# Patient Record
Sex: Male | Born: 1985 | Race: Black or African American | Hispanic: No | Marital: Single | Smoking: Former smoker
Health system: Southern US, Community
[De-identification: ages and names within clinical notes are randomized; demographics above are authoritative.]

## PROBLEM LIST (undated history)

## (undated) DIAGNOSIS — I10 Essential (primary) hypertension: Secondary | ICD-10-CM

## (undated) DIAGNOSIS — G4730 Sleep apnea, unspecified: Secondary | ICD-10-CM

## (undated) HISTORY — PX: ABDOMINAL SURGERY: SHX537

---

## 2006-09-16 ENCOUNTER — Emergency Department (HOSPITAL_COMMUNITY): Admission: EM | Admit: 2006-09-16 | Discharge: 2006-09-16 | Payer: Self-pay | Admitting: Emergency Medicine

## 2006-09-20 ENCOUNTER — Emergency Department (HOSPITAL_COMMUNITY): Admission: EM | Admit: 2006-09-20 | Discharge: 2006-09-20 | Payer: Self-pay | Admitting: Emergency Medicine

## 2006-09-27 ENCOUNTER — Ambulatory Visit (HOSPITAL_COMMUNITY): Admission: RE | Admit: 2006-09-27 | Discharge: 2006-09-27 | Payer: Self-pay | Admitting: Orthopaedic Surgery

## 2009-01-02 ENCOUNTER — Inpatient Hospital Stay (HOSPITAL_COMMUNITY): Admission: AC | Admit: 2009-01-02 | Discharge: 2009-01-08 | Payer: Self-pay

## 2011-03-07 LAB — BASIC METABOLIC PANEL
BUN: 9 mg/dL (ref 6–23)
CO2: 29 mEq/L (ref 19–32)
CO2: 30 mEq/L (ref 19–32)
Calcium: 9.1 mg/dL (ref 8.4–10.5)
Chloride: 99 mEq/L (ref 96–112)
Creatinine, Ser: 1.31 mg/dL (ref 0.4–1.5)
GFR calc Af Amer: >60 mL/min (ref >60)
GFR calc Af Amer: >60 mL/min (ref >60)
GFR calc non Af Amer: >60 mL/min (ref >60)
Glucose, Bld: 98 mg/dL (ref 70–99)
Glucose, Bld: 98 mg/dL (ref 70–99)
Potassium: 3.5 mEq/L (ref 3.5–5.1)
Potassium: 4.1 mEq/L (ref 3.5–5.1)
Sodium: 135 mEq/L (ref 135–145)

## 2011-03-07 LAB — TYPE AND SCREEN: ABO/RH(D): O POS

## 2011-03-07 LAB — POCT I-STAT, CHEM 8
Chloride: 105 meq/L (ref 96–112)
Creatinine, Ser: 1.3 mg/dL (ref 0.4–1.5)
Glucose, Bld: 105 mg/dL — ABNORMAL HIGH (ref 70–99)
Potassium: 3.3 meq/L — ABNORMAL LOW (ref 3.5–5.1)

## 2011-03-07 LAB — CBC
HCT: 38.8 % — ABNORMAL LOW (ref 39.0–52.0)
HCT: 41.5 % (ref 39.0–52.0)
Hemoglobin: 14.3 g/dL (ref 13.0–17.0)
MCHC: 34.3 g/dL (ref 30.0–36.0)
MCHC: 34.7 g/dL (ref 30.0–36.0)
MCHC: 35 g/dL (ref 30.0–36.0)
MCV: 92.8 fL (ref 78.0–100.0)
Platelets: 265 10*3/uL (ref 150–400)
RBC: 4.11 MIL/uL — ABNORMAL LOW (ref 4.22–5.81)
RBC: 4.36 MIL/uL (ref 4.22–5.81)
RBC: 4.53 MIL/uL (ref 4.22–5.81)
RDW: 12.6 % (ref 11.5–15.5)
WBC: 8.8 10*3/uL (ref 4.0–10.5)

## 2011-03-07 LAB — DIFFERENTIAL
Basophils Absolute: 0 10*3/uL (ref 0.0–0.1)
Basophils Absolute: 0 10*3/uL (ref 0.0–0.1)
Basophils Relative: 0 % (ref 0–1)
Eosinophils Absolute: 0 10*3/uL (ref 0.0–0.7)
Eosinophils Relative: 0 % (ref 0–5)
Lymphs Abs: 2.4 10*3/uL (ref 0.7–4.0)
Monocytes Relative: 7 % (ref 3–12)
Neutro Abs: 6.7 10*3/uL (ref 1.7–7.7)
Neutrophils Relative %: 72 % (ref 43–77)

## 2011-03-07 LAB — PROTIME-INR
INR: 1 (ref 0.00–1.49)
Prothrombin Time: 12.8 s (ref 11.6–15.2)
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
## 2015-08-08 ENCOUNTER — Emergency Department (HOSPITAL_COMMUNITY): Payer: BLUE CROSS/BLUE SHIELD

## 2015-08-08 ENCOUNTER — Emergency Department (HOSPITAL_COMMUNITY)
Admission: EM | Admit: 2015-08-08 | Discharge: 2015-08-08 | Disposition: A | Discharge status: Home / Self Care | Payer: BLUE CROSS/BLUE SHIELD | Attending: Emergency Medicine | Admitting: Emergency Medicine

## 2015-08-08 ENCOUNTER — Encounter (HOSPITAL_COMMUNITY): Payer: Self-pay | Admitting: Emergency Medicine

## 2015-08-08 DIAGNOSIS — M5432 Sciatica, left side: Secondary | ICD-10-CM | POA: Diagnosis not present

## 2015-08-08 DIAGNOSIS — M79605 Pain in left leg: Secondary | ICD-10-CM | POA: Diagnosis present

## 2015-08-08 MED ORDER — HYDROCODONE-ACETAMINOPHEN 5-325 MG PO TABS: 2.0000 | ORAL_TABLET | ORAL | Status: DC | PRN

## 2015-08-08 NOTE — Discharge Instructions (Signed)
Sciatica °Sciatica is pain, weakness, numbness, or tingling along your sciatic nerve. The nerve starts in the lower back and runs down the back of each leg. Nerve damage or certain conditions pinch or put pressure on the sciatic nerve. This causes the pain, weakness, and other discomforts of sciatica. °HOME CARE  °· Only take medicine as told by your doctor. °· Apply ice to the affected area for 20 minutes. Do this 3-4 times a day for the first 48-72 hours. Then try heat in the same way. °· Exercise, stretch, or do your usual activities if these do not make your pain worse. °· Go to physical therapy as told by your doctor. °· Keep all doctor visits as told. °· Do not wear high heels or shoes that are not supportive. °· Get a firm mattress if your mattress is too soft to lessen pain and discomfort. °GET HELP RIGHT AWAY IF:  °· You cannot control when you poop (bowel movement) or pee (urinate). °· You have more weakness in your lower back, lower belly (pelvis), butt (buttocks), or legs. °· You have redness or puffiness (swelling) of your back. °· You have a burning feeling when you pee. °· You have pain that gets worse when you lie down. °· You have pain that wakes you from your sleep. °· Your pain is worse than past pain. °· Your pain lasts longer than 4 weeks. °· You are suddenly losing weight without reason. °MAKE SURE YOU:  °· Understand these instructions. °· Will watch this condition. °· Will get help right away if you are not doing well or get worse. °Document Released: 08/15/2008 Document Revised: 05/07/2012 Document Reviewed: 03/17/2012 °ExitCare® Patient Information ©2015 [employer]. This information is not intended to replace advice given to you by your health care provider. Make sure you discuss any questions you have with your health care provider. ° °

## 2015-08-08 NOTE — ED Notes (Signed)
Bed: WA22 Expected date:  Expected time:  Means of arrival:  Comments: 

## 2015-08-08 NOTE — ED Notes (Addendum)
Patient here with complaints of left leg pain. Reports that he has been standing for the past 17hrs. Pain radiates down into calf. Ambulatory.

## 2015-08-08 NOTE — ED Provider Notes (Signed)
CSN: [id number]     Arrival date & time 08/08/15  4098 History   First MD Initiated Contact with Patient 08/08/15 0801     Chief Complaint  Patient presents with  . Leg Pain      HPI Patient presents with complaint of numbness which started early this morning to the left leg seems to radiate from the buttocks down to the third fourth and fifth toes of the left foot.  Feels like it's asleep.  Patient has past history of scoliosis and does work 2 jobs and is on his feet for prolonged periods of time.  He does also have some pain in his left buttock area which is not present on the right.  Denies any weakness.  Denies bowel or bladder incontinence. History reviewed. No pertinent past medical history. History reviewed. No pertinent past surgical history. No family history on file. Social History  Substance Use Topics  . Smoking status: Never Smoker   . Smokeless tobacco: None  . Alcohol Use: No    Review of Systems  All other systems reviewed and are negative  Allergies  Review of patient's allergies indicates not on file.  Home Medications   Prior to Admission medications   Medication Sig Start Date End Date Taking? Authorizing Provider  HYDROcodone-acetaminophen (NORCO/VICODIN) 5-325 MG per tablet Take 2 tablets by mouth every 4 (four) hours as needed. 08/08/15   Nelva Nay, MD   BP 166/120 mmHg  Pulse 102  Temp(Src) 98 F (36.7 C) (Oral)  Resp 20  SpO2 100% Physical Exam  Constitutional: He is oriented to person, place, and time. He appears well-developed and well-nourished. No distress.  HENT:  Head: Normocephalic and atraumatic.  Eyes: Pupils are equal, round, and reactive to light.  Neck: Normal range of motion.  Cardiovascular: Normal rate and intact distal pulses.   Pulmonary/Chest: No respiratory distress.  Abdominal: Normal appearance. He exhibits no distension.  Musculoskeletal: Normal range of motion.       Back:  Neurological: He is alert and oriented to  person, place, and time. A sensory deficit is present. No cranial nerve deficit. GCS eye subscore is 4. GCS verbal subscore is 5. GCS motor subscore is 6.  Patient has subjective numbness left leg anterior part involving third fourth and fifth toes of left foot.  Patient has good dorsalis pedis and posterior tibial pulses bilaterally.  Skin is warm and dry with no evidence of vascular compromise.  Skin: Skin is warm and dry. No rash noted.  Psychiatric: He has a normal mood and affect. His behavior is normal.  Nursing note and vitals reviewed.   ED Course  Procedures (including critical care time) Labs Review Labs Reviewed - No data to display  Imaging Review Dg Lumbar Spine Complete  08/08/2015   CLINICAL DATA:  numbness L leg Left side lower back pain that radiates down to left leg this morning with no known injury; pt states hx scoliosis  EXAM: LUMBAR SPINE - COMPLETE 4+ VIEW  COMPARISON:  None.  FINDINGS: Moderate sigmoid scoliotic curvature thoracolumbar spine. Normal anterior-posterior alignment. No fracture. No significant degenerative change.  IMPRESSION: Scoliosis with no other significant findings   Electronically Signed   By: Esperanza Heir M.D.   On: 08/08/2015 08:56   I have personally reviewed and evaluated these images and lab results as part of my medical decision-making.    MDM   Final diagnoses:  Sciatica, left        Nelva Nay, MD 08/08/15  0950 

## 2016-01-02 ENCOUNTER — Ambulatory Visit (HOSPITAL_BASED_OUTPATIENT_CLINIC_OR_DEPARTMENT_OTHER): Payer: BLUE CROSS/BLUE SHIELD | Attending: Family Medicine

## 2016-01-02 VITALS — Ht 72.0 in | Wt 260.0 lb

## 2016-01-02 DIAGNOSIS — G4733 Obstructive sleep apnea (adult) (pediatric): Secondary | ICD-10-CM | POA: Insufficient documentation

## 2016-01-02 DIAGNOSIS — I119 Hypertensive heart disease without heart failure: Secondary | ICD-10-CM | POA: Diagnosis not present

## 2016-01-02 DIAGNOSIS — Z6837 Body mass index (BMI) 37.0-37.9, adult: Secondary | ICD-10-CM | POA: Insufficient documentation

## 2016-01-02 DIAGNOSIS — E669 Obesity, unspecified: Secondary | ICD-10-CM | POA: Diagnosis not present

## 2016-01-02 DIAGNOSIS — G4736 Sleep related hypoventilation in conditions classified elsewhere: Secondary | ICD-10-CM | POA: Diagnosis not present

## 2016-01-02 DIAGNOSIS — R5383 Other fatigue: Secondary | ICD-10-CM | POA: Insufficient documentation

## 2016-01-02 DIAGNOSIS — R0683 Snoring: Secondary | ICD-10-CM | POA: Insufficient documentation

## 2016-01-09 DIAGNOSIS — G4733 Obstructive sleep apnea (adult) (pediatric): Secondary | ICD-10-CM | POA: Diagnosis not present

## 2016-01-09 DIAGNOSIS — G4736 Sleep related hypoventilation in conditions classified elsewhere: Secondary | ICD-10-CM | POA: Diagnosis not present

## 2016-01-09 NOTE — Progress Notes (Signed)
   Patient Name: Nathan Heath, Nathan Heath Date: 01/02/2016 Gender: Male D.O.B: [birth year] Age (years): 29 Referring Provider: Renaye Rakers Height (inches): 70 Interpreting Physician: Jetty Duhamel MD, ABSM Weight (lbs): 260 RPSGT: Cherylann Parr BMI: 37 MRN: [id number] Neck Size: 19.00 CLINICAL INFORMATION Sleep Study Type: NPSG Indication for sleep study: Fatigue, Hypertension, Obesity, OSA, Snoring, Witnessed Apneas Epworth Sleepiness Score: 9  SLEEP STUDY TECHNIQUE As per the AASM Manual for the Scoring of Sleep and Associated Events v2.3 (April 2016) with a hypopnea requiring 4% desaturations. The channels recorded and monitored were frontal, central and occipital EEG, electrooculogram (EOG), submentalis EMG (chin), nasal and oral airflow, thoracic and abdominal wall motion, anterior tibialis EMG, snore microphone, electrocardiogram, and pulse oximetry.  MEDICATIONS Patient's medications include: charted for review. Medications self-administered by patient during sleep study : No sleep medicine administered.  SLEEP ARCHITECTURE The study was initiated at 10:34:30 PM and ended at 5:29:18 AM. Sleep onset time was 22.4 minutes and the sleep efficiency was 63.9%. The total sleep time was 264.9 minutes. Stage REM latency was 305.5 minutes. The patient spent 8.49% of the night in stage N1 sleep, 75.65% in stage N2 sleep, 0.00% in stage N3 and 15.85% in REM. Alpha intrusion was absent. Supine sleep was 43.03%. Wake after sleep onset 127 minutes  RESPIRATORY PARAMETERS The overall apnea/hypopnea index (AHI) was 65.5 per hour. There were 67 total apneas, including 62 obstructive, 5 central and 0 mixed apneas. There were 222 hypopneas and 0 RERAs. The AHI during Stage REM sleep was 65.7 per hour. AHI while supine was 66.8 per hour. The mean oxygen saturation was 93.65%. The minimum SpO2 during sleep was 79.00%. Loud snoring was noted during this study.  CARDIAC DATA The 2 lead EKG  demonstrated sinus rhythm. The mean heart rate was 79.11 beats per minute. Other EKG findings include: None.  LEG MOVEMENT DATA The total PLMS were 104 with a resulting PLMS index of 23.55. Associated arousal with leg movement index was 0.0 .  IMPRESSIONS - Severe obstructive sleep apnea occurred during this study (AHI = 65.5/h). - Tech reported insufficient early sleep time to meet protocol requirements for split CPAP titration. Tech described patient having "panic attacks" after which he would sit up for awhile looking at phone.Unclear if these events were apnea triggered. - No significant central sleep apnea occurred during this study (CAI = 1.1/h). - Moderate oxygen desaturation was noted during this study (Min O2 = 79.00%). - The patient snored with Loud snoring volume. - No cardiac abnormalities were noted during this study. - Mild periodic limb movements of sleep occurred during the study. No significant associated arousals.  DIAGNOSIS - Obstructive Sleep Apnea (327.23 [G47.33 ICD-10]) - Nocturnal Hypoxemia (327.26 [G47.36 ICD-10])  RECOMMENDATIONS - Therapeutic CPAP titration to determine optimal pressure required to alleviate sleep disordered breathing. - Avoid alcohol, sedatives and other CNS depressants that may worsen sleep apnea and disrupt normal sleep architecture. - Sleep hygiene should be reviewed to assess factors that may improve sleep quality. - Weight management and regular exercise should be initiated or continued if appropriate.  Waymon Budge Diplomate, American Board of Sleep Medicine  ELECTRONICALLY SIGNED ON:  01/09/2016, 9:21 AM [facility] PH: [phone]   FX: [phone] ACCREDITED BY THE AMERICAN ACADEMY OF SLEEP MEDICINE

## 2016-09-25 ENCOUNTER — Other Ambulatory Visit (HOSPITAL_BASED_OUTPATIENT_CLINIC_OR_DEPARTMENT_OTHER): Payer: Self-pay

## 2016-09-25 DIAGNOSIS — G4730 Sleep apnea, unspecified: Secondary | ICD-10-CM

## 2016-09-25 DIAGNOSIS — R0683 Snoring: Secondary | ICD-10-CM

## 2016-10-27 ENCOUNTER — Ambulatory Visit (HOSPITAL_BASED_OUTPATIENT_CLINIC_OR_DEPARTMENT_OTHER): Payer: BLUE CROSS/BLUE SHIELD

## 2016-10-29 ENCOUNTER — Ambulatory Visit (HOSPITAL_BASED_OUTPATIENT_CLINIC_OR_DEPARTMENT_OTHER): Payer: BLUE CROSS/BLUE SHIELD | Attending: Family Medicine | Admitting: Internal Medicine

## 2016-10-29 DIAGNOSIS — G4733 Obstructive sleep apnea (adult) (pediatric): Secondary | ICD-10-CM | POA: Insufficient documentation

## 2016-10-29 DIAGNOSIS — E669 Obesity, unspecified: Secondary | ICD-10-CM | POA: Insufficient documentation

## 2016-10-29 DIAGNOSIS — Z6836 Body mass index (BMI) 36.0-36.9, adult: Secondary | ICD-10-CM | POA: Insufficient documentation

## 2016-10-29 DIAGNOSIS — G4761 Periodic limb movement disorder: Secondary | ICD-10-CM | POA: Insufficient documentation

## 2016-10-29 DIAGNOSIS — G4730 Sleep apnea, unspecified: Secondary | ICD-10-CM

## 2016-10-29 DIAGNOSIS — R0683 Snoring: Secondary | ICD-10-CM | POA: Diagnosis present

## 2016-11-04 DIAGNOSIS — G4730 Sleep apnea, unspecified: Secondary | ICD-10-CM

## 2016-11-04 NOTE — Procedures (Signed)
Patient Name: Nathan Heath, Nathan Heath Date: 10/29/2016 Gender: Male D.O.B: September 01, 1986 Age (years): 30 Referring Provider: Lucianne Lei Height (inches): 71 Interpreting Physician: Nathan Heath Lyons MD, ABSM Weight (lbs): 260 RPSGT: Nathan Heath Nathan Heath BMI: 36 MRN: [id number] Neck Size: 19.00 CLINICAL INFORMATION Sleep Study Type: Split Night CPAP  Indication for sleep study: Fatigue, Obesity, Snoring, Witnessed Apneas  Epworth Sleepiness Score: 11  SLEEP STUDY TECHNIQUE As per the AASM Manual for the Scoring of Sleep and Associated Events v2.3 (April 2016) with a hypopnea requiring 4% desaturations.  The channels recorded and monitored were frontal, central and occipital EEG, electrooculogram (EOG), submentalis EMG (chin), nasal and oral airflow, thoracic and abdominal wall motion, anterior tibialis EMG, snore microphone, electrocardiogram, and pulse oximetry. Continuous positive airway pressure (CPAP) was initiated when the patient met split night criteria and was titrated according to treat sleep-disordered breathing.  MEDICATIONS Medications self-administered by patient taken the night of the study : none reported  RESPIRATORY PARAMETERS Diagnostic  Total AHI (/hr): 57.4 RDI (/hr): 60.8 OA Index (/hr): 31.8 CA Index (/hr): 1.7 REM AHI (/hr): 98.7 NREM AHI (/hr): 48.4 Supine AHI (/hr): 57.4 Non-supine AHI (/hr): N/A Min O2 Sat (%): 68.00 Mean O2 (%): 93.31 Time below 88% (min): 12.8   Titration  Optimal Pressure (cm): 13 AHI at Optimal Pressure (/hr): 2.3 Min O2 at Optimal Pressure (%): 93.0 Supine % at Optimal (%): 100 Sleep % at Optimal (%): 100   SLEEP ARCHITECTURE The recording time for the entire night was 390.1 minutes.  During a baseline period of 194.4 minutes, the patient slept for 173.6 minutes in REM and nonREM, yielding a sleep efficiency of 89.3%. Sleep onset after lights out was 10.9 minutes with a REM latency of 82.5 minutes. The patient spent 11.52% of the night in  stage N1 sleep, 70.61% in stage N2 sleep, 0.00% in stage N3 and 17.86% in REM.  During the titration period of 185.5 minutes, the patient slept for 160.7 minutes in REM and nonREM, yielding a sleep efficiency of 86.6%. Sleep onset after CPAP initiation was 11.3 minutes with a REM latency of 63.5 minutes. The patient spent 17.24% of the night in stage N1 sleep, 50.41% in stage N2 sleep, 0.00% in stage N3 and 32.36% in REM.  CARDIAC DATA The 2 lead EKG demonstrated sinus rhythm. The mean heart rate was 65.73 beats per minute. Other EKG findings include: None. LEG MOVEMENT DATA The total Periodic Limb Movements of Sleep (PLMS) were 44. The PLMS index was 7.88 .  IMPRESSIONS - Severe obstructive sleep apnea occurred during the diagnostic portion of the study (AHI = 57.4/hour). An optimal PAP pressure was selected for this patient ( 13 cm of water) - No significant central sleep apnea occurred during the diagnostic portion of the study (CAI = 1.7/hour). - Mild oxygen desaturation was noted during the diagnostic portion of the study (Min O2 = 68.00%). - No snoring was audible during the diagnostic portion of the study. - No cardiac abnormalities were noted during this study. - Mild periodic limb movements of sleep occurred during the study.  DIAGNOSIS - Obstructive Sleep Apnea (327.23 [G47.33 ICD-10]) - Periodic Limb Movement Syndrome (327.51 [G47.61 ICD-10])  RECOMMENDATIONS - Trial of CPAP therapy on 13 cm H2O with a Medium size Resmed Full Face Mask AirFit F20 mask and heated humidification. - Avoid alcohol, sedatives and other CNS depressants that may worsen sleep apnea and disrupt normal sleep architecture. - Sleep hygiene should be reviewed to assess factors that may improve sleep quality. -  Weight management and regular exercise should be initiated or continued.  [Electronically signed] 11/04/2016 11:11 AM  Nathan Heath Lyons MD, ABSM Diplomate, American Board of Sleep Medicine   NPI:  [id number]  [place], American Board of Sleep Medicine  ELECTRONICALLY SIGNED ON:  11/04/2016, 11:09 AM [place] PH: [phone]   FX: [phone] [place]

## 2016-11-29 ENCOUNTER — Ambulatory Visit (HOSPITAL_COMMUNITY)
Admission: EM | Admit: 2016-11-29 | Discharge: 2016-11-29 | Disposition: A | Discharge status: Home / Self Care | Payer: BLUE CROSS/BLUE SHIELD

## 2016-11-29 ENCOUNTER — Encounter (HOSPITAL_COMMUNITY): Payer: Self-pay | Admitting: Emergency Medicine

## 2016-11-29 DIAGNOSIS — R05 Cough: Secondary | ICD-10-CM | POA: Diagnosis not present

## 2016-11-29 DIAGNOSIS — J00 Acute nasopharyngitis [common cold]: Secondary | ICD-10-CM

## 2016-11-29 DIAGNOSIS — R059 Cough, unspecified: Secondary | ICD-10-CM

## 2016-11-29 HISTORY — DX: Essential (primary) hypertension: I10

## 2016-11-29 MED ORDER — AZITHROMYCIN 250 MG PO TABS: 250.0000 mg | ORAL_TABLET | Freq: Every day | ORAL | 0 refills | Status: DC

## 2016-11-29 MED ORDER — IPRATROPIUM BROMIDE 0.02 % IN SOLN: 0.5000 mg | Freq: Four times a day (QID) | RESPIRATORY_TRACT | 12 refills | Status: DC

## 2016-11-29 NOTE — ED Provider Notes (Signed)
CSN: [id number]     Arrival date & time 11/29/16  1919 History   First MD Initiated Contact with Patient 11/29/16 2027     Chief Complaint  Patient presents with  . Cough   (Consider location/radiation/quality/duration/timing/severity/associated sxs/prior Treatment) Patient c/o cough congestion and uri sx's for 3 days.   The history is provided by the patient.  Cough  Cough characteristics:  Productive Sputum characteristics:  Yellow Severity:  Mild Onset quality:  Sudden Duration:  3 days Timing:  Constant Chronicity:  New Context: upper respiratory infection and weather changes   Worsened by:  Nothing Associated symptoms: chills, fever, myalgias and rhinorrhea   Associated symptoms: no chest pain     Past Medical History:  Diagnosis Date  . Hypertension    History reviewed. No pertinent surgical history. History reviewed. No pertinent family history. Social History  Substance Use Topics  . Smoking status: Never Smoker  . Smokeless tobacco: Never Used  . Alcohol use No    Review of Systems  Constitutional: Positive for chills and fever.  HENT: Positive for rhinorrhea.   Eyes: Negative.   Respiratory: Positive for cough.   Cardiovascular: Negative.  Negative for chest pain.  Gastrointestinal: Negative.   Endocrine: Negative.   Musculoskeletal: Positive for myalgias.  Allergic/Immunologic: Negative.   Neurological: Negative.   Hematological: Negative.     Allergies  Patient has no known allergies.  Home Medications   Prior to Admission medications   Medication Sig Start Date End Date Taking? Authorizing Provider  amLODipine (NORVASC) 10 MG tablet Take 10 mg by mouth daily.   Yes Historical Provider, MD  Azilsartan-Chlorthalidone (EDARBYCLOR) 40-12.5 MG TABS Take by mouth.   Yes Historical Provider, MD  azithromycin (ZITHROMAX) 250 MG tablet Take 1 tablet (250 mg total) by mouth daily. Take first 2 tablets together, then 1 every day until finished. 11/29/16    Deatra Canter, FNP  ipratropium (ATROVENT) 0.02 % nebulizer solution Take 2.5 mLs (0.5 mg total) by nebulization 4 (four) times daily. 11/29/16   Deatra Canter, FNP   Meds Ordered and Administered this Visit  Medications - No data to display  BP 119/74 (BP Location: Right Arm)   Pulse 92   Temp 99.5 F (37.5 C) (Oral)   Resp 18   SpO2 100%  No data found.   Physical Exam  Constitutional: He appears well-developed and well-nourished.  HENT:  Head: Normocephalic and atraumatic.  Right Ear: External ear normal.  Left Ear: External ear normal.  Mouth/Throat: Oropharynx is clear and moist.  Eyes: Conjunctivae and EOM are normal. Pupils are equal, round, and reactive to light.  Neck: Normal range of motion. Neck supple.  Cardiovascular: Normal rate, regular rhythm and normal heart sounds.   Pulmonary/Chest: Effort normal.  Abdominal: Soft. Bowel sounds are normal.  Nursing note and vitals reviewed.   Urgent Care Course   Clinical Course     Procedures (including critical care time)  Labs Review Labs Reviewed - No data to display  Imaging Review No results found.   Visual Acuity Review  Right Eye Distance:   Left Eye Distance:   Bilateral Distance:    Right Eye Near:   Left Eye Near:    Bilateral Near:         MDM   1. Cough   2. Acute nasopharyngitis    zpak Tessalon atrovent nasal spray  Push po fluids, rest, tylenol and motrin otc prn as directed for fever, arthralgias, and myalgias.  Follow  up prn if sx's continue or persist.    Deatra CanterWilliam J Oxford, FNP 11/29/16 2057

## 2016-11-29 NOTE — ED Triage Notes (Signed)
[facility] a [facility], chills and body aches x 3 days.

## 2017-04-03 IMAGING — CR DG LUMBAR SPINE COMPLETE 4+V
5 series · 5 of 5 positions shown · non-contrast
Comparison: None.

CLINICAL DATA: numbness L leg Left side lower back pain that
radiates down to left leg this morning with no known injury; pt
states hx scoliosis

EXAM:
LUMBAR SPINE - COMPLETE 4+ VIEW

[t lumbar spine ap]
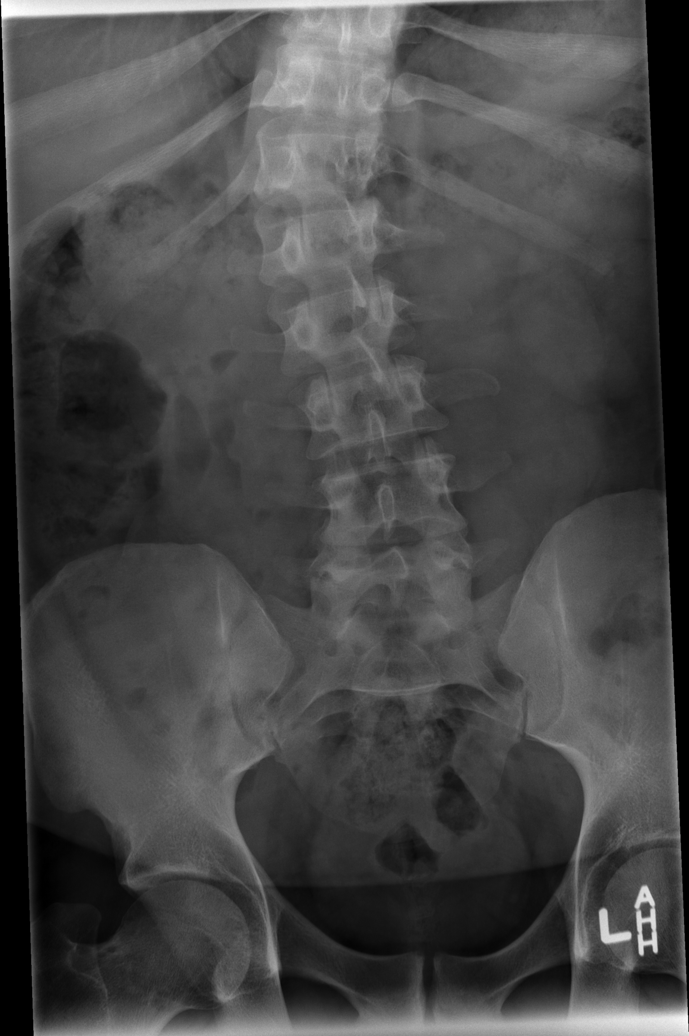

[t lumbar spine obl (1 of 2)]
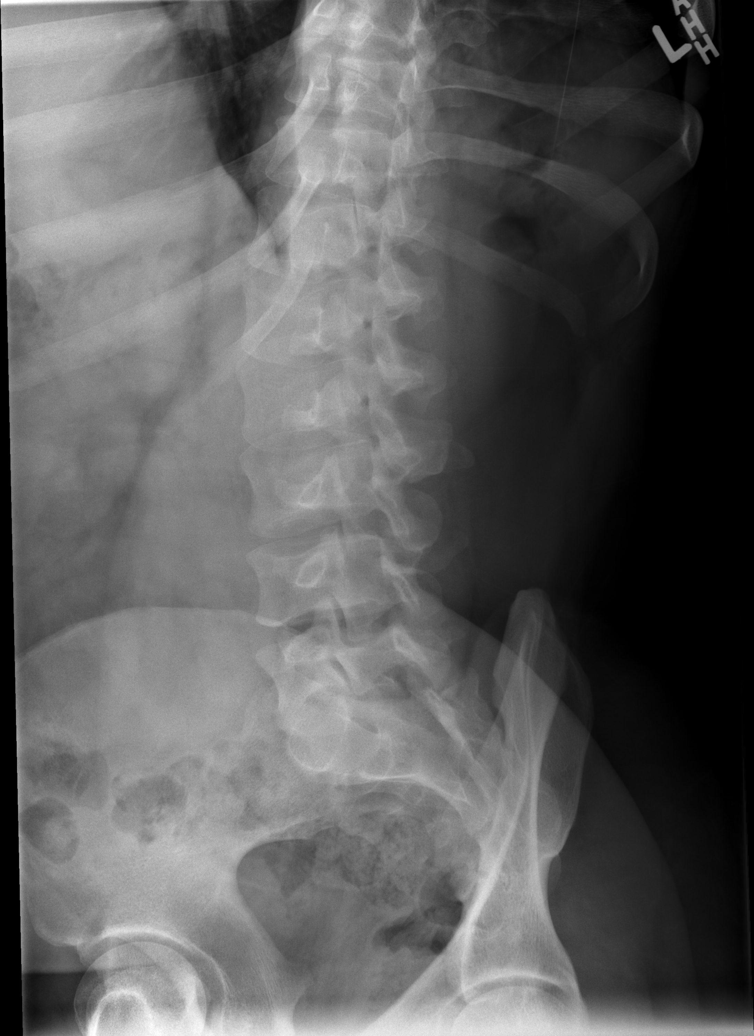

[t lumbar spine obl (2 of 2)]
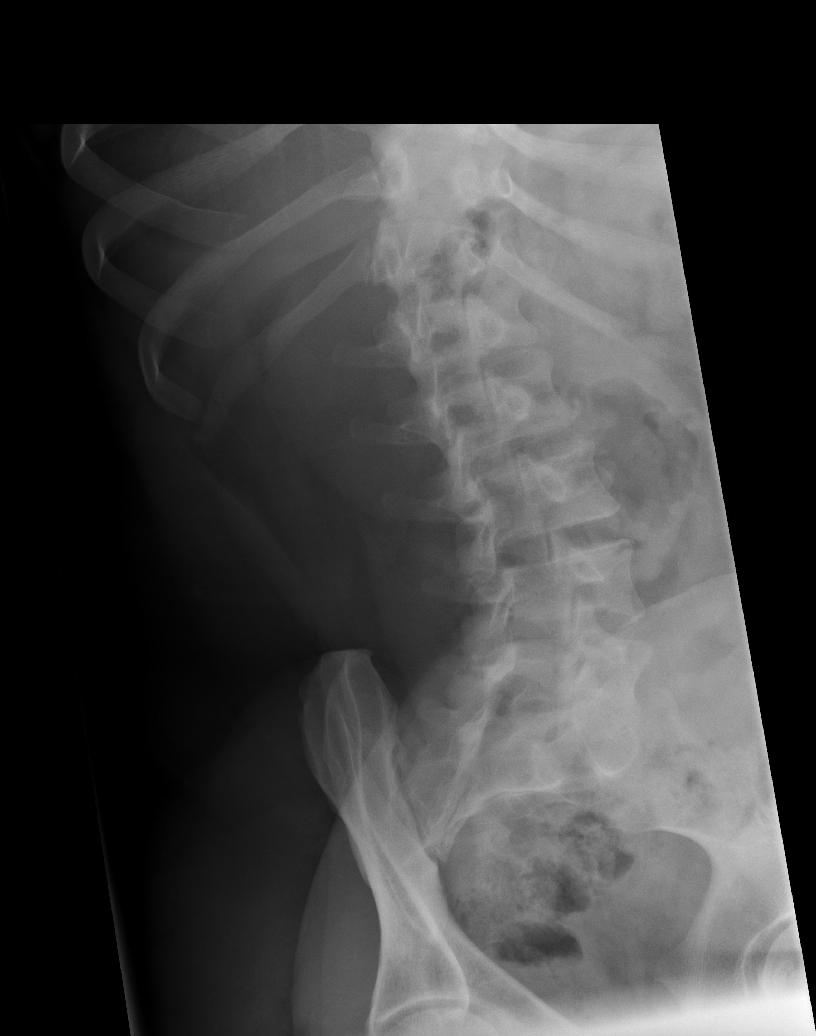

[t lumbar spine lat]
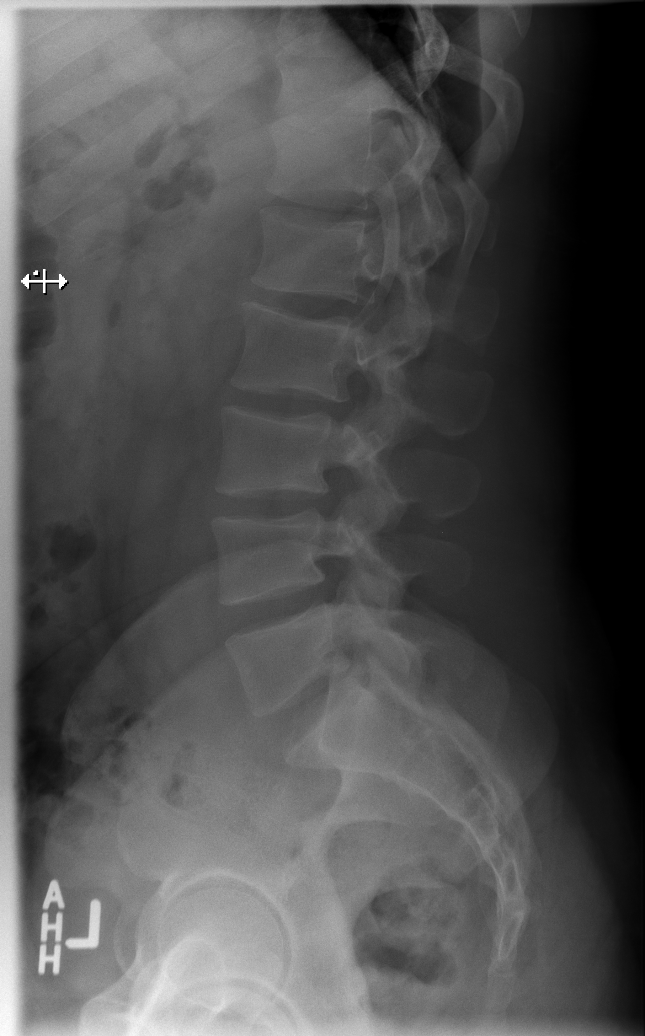

[t lumbar l-5 s-1 spot]
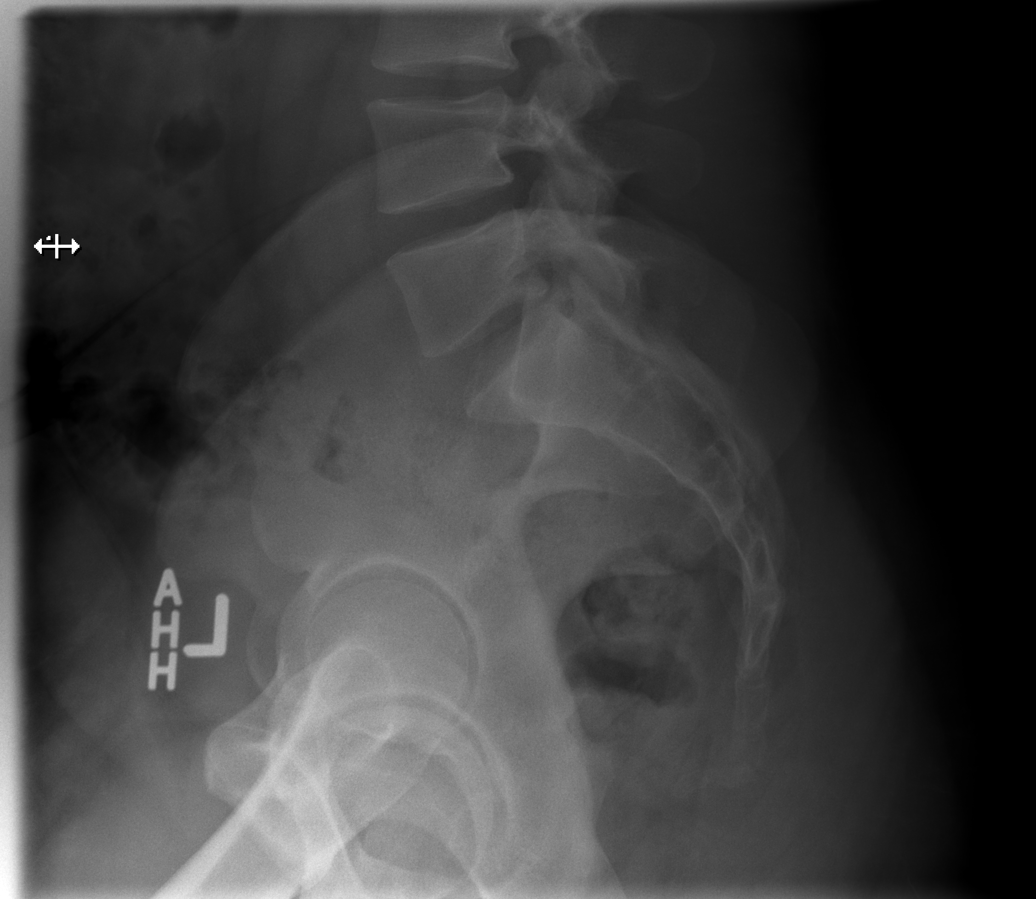

[5 of 5 positions shown; findings below may reference images not displayed]

FINDINGS: Moderate sigmoid scoliotic curvature thoracolumbar spine. Normal
anterior-posterior alignment. No fracture. No significant
degenerative change.
IMPRESSION: Scoliosis with no other significant findings

## 2019-04-19 ENCOUNTER — Other Ambulatory Visit: Payer: Self-pay

## 2019-04-19 ENCOUNTER — Emergency Department (HOSPITAL_BASED_OUTPATIENT_CLINIC_OR_DEPARTMENT_OTHER)
Admission: EM | Admit: 2019-04-19 | Discharge: 2019-04-19 | Disposition: A | Discharge status: Home / Self Care | Payer: BLUE CROSS/BLUE SHIELD | Attending: Emergency Medicine | Admitting: Emergency Medicine

## 2019-04-19 ENCOUNTER — Encounter (HOSPITAL_BASED_OUTPATIENT_CLINIC_OR_DEPARTMENT_OTHER): Payer: Self-pay | Admitting: *Deleted

## 2019-04-19 DIAGNOSIS — X58XXXA Exposure to other specified factors, initial encounter: Secondary | ICD-10-CM | POA: Insufficient documentation

## 2019-04-19 DIAGNOSIS — S0502XA Injury of conjunctiva and corneal abrasion without foreign body, left eye, initial encounter: Secondary | ICD-10-CM | POA: Insufficient documentation

## 2019-04-19 DIAGNOSIS — Z87891 Personal history of nicotine dependence: Secondary | ICD-10-CM | POA: Insufficient documentation

## 2019-04-19 DIAGNOSIS — Y9389 Activity, other specified: Secondary | ICD-10-CM | POA: Insufficient documentation

## 2019-04-19 DIAGNOSIS — Y929 Unspecified place or not applicable: Secondary | ICD-10-CM | POA: Insufficient documentation

## 2019-04-19 DIAGNOSIS — Z79899 Other long term (current) drug therapy: Secondary | ICD-10-CM | POA: Insufficient documentation

## 2019-04-19 DIAGNOSIS — Y998 Other external cause status: Secondary | ICD-10-CM | POA: Insufficient documentation

## 2019-04-19 DIAGNOSIS — I10 Essential (primary) hypertension: Secondary | ICD-10-CM | POA: Insufficient documentation

## 2019-04-19 MED ORDER — LISINOPRIL 10 MG PO TABS
10.0000 mg | ORAL_TABLET | Freq: Once | ORAL | Status: AC
  Administered 2019-04-19: 10 mg via ORAL
  Filled 2019-04-19: qty 1

## 2019-04-19 MED ORDER — LISINOPRIL 10 MG PO TABS: 10.0000 mg | ORAL_TABLET | Freq: Every day | ORAL | 0 refills | Status: DC

## 2019-04-19 MED ORDER — FLUORESCEIN SODIUM 1 MG OP STRP
1.0000 | ORAL_STRIP | Freq: Once | OPHTHALMIC | Status: AC
  Administered 2019-04-19: 17:00:00 1 via OPHTHALMIC
  Filled 2019-04-19: qty 1

## 2019-04-19 MED ORDER — HYDROCHLOROTHIAZIDE 25 MG PO TABS: 25.0000 mg | ORAL_TABLET | Freq: Every day | ORAL | 0 refills | Status: DC

## 2019-04-19 MED ORDER — POLYMYXIN B-TRIMETHOPRIM 10000-0.1 UNIT/ML-% OP SOLN
1.0000 [drp] | OPHTHALMIC | Status: DC
  Administered 2019-04-19: 17:00:00 1 [drp] via OPHTHALMIC
  Filled 2019-04-19: qty 10

## 2019-04-19 MED ORDER — TETRACAINE HCL 0.5 % OP SOLN
2.0000 [drp] | Freq: Once | OPHTHALMIC | Status: AC
  Administered 2019-04-19: 17:00:00 2 [drp] via OPHTHALMIC
  Filled 2019-04-19: qty 4

## 2019-04-19 MED ORDER — HYDROCHLOROTHIAZIDE 25 MG PO TABS
25.0000 mg | ORAL_TABLET | Freq: Once | ORAL | Status: AC
  Administered 2019-04-19: 25 mg via ORAL
  Filled 2019-04-19: qty 1

## 2019-04-19 NOTE — Discharge Instructions (Signed)
Use polytrim twice daily for 4 days.   Take lisinopril and HCTZ for hypertension   You need to see your doctor. You need to follow up with a primary care doctor  Return to ER if you have worse eye pain, headaches, trouble speaking, weakness, numbness
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
## 2019-04-19 NOTE — ED Notes (Signed)
Pt verbalized understanding of dc instructions.
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
## 2019-07-29 ENCOUNTER — Ambulatory Visit (HOSPITAL_COMMUNITY)
Admission: EM | Admit: 2019-07-29 | Discharge: 2019-07-29 | Disposition: A | Discharge status: Home / Self Care | Payer: Self-pay | Attending: Family Medicine | Admitting: Family Medicine

## 2019-07-29 ENCOUNTER — Encounter (HOSPITAL_COMMUNITY): Payer: Self-pay | Admitting: Emergency Medicine

## 2019-07-29 ENCOUNTER — Other Ambulatory Visit: Payer: Self-pay

## 2019-07-29 DIAGNOSIS — R21 Rash and other nonspecific skin eruption: Secondary | ICD-10-CM

## 2019-07-29 DIAGNOSIS — I10 Essential (primary) hypertension: Secondary | ICD-10-CM

## 2019-07-29 MED ORDER — PREDNISONE 10 MG (21) PO TBPK: ORAL_TABLET | Freq: Every day | ORAL | 0 refills | Status: DC
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
## 2019-07-29 NOTE — ED Triage Notes (Signed)
PT  Cleaned out a dog kennel 4 days ago. PT also got a new vest for work that day. The next day PT broke out in a generalized rash.

## 2019-11-13 ENCOUNTER — Emergency Department (HOSPITAL_COMMUNITY): Payer: Self-pay

## 2019-11-13 ENCOUNTER — Emergency Department (HOSPITAL_COMMUNITY)
Admission: EM | Admit: 2019-11-13 | Discharge: 2019-11-13 | Disposition: A | Discharge status: Home / Self Care | Payer: Self-pay | Attending: Emergency Medicine | Admitting: Emergency Medicine

## 2019-11-13 ENCOUNTER — Other Ambulatory Visit: Payer: Self-pay

## 2019-11-13 DIAGNOSIS — Z20822 Contact with and (suspected) exposure to covid-19: Secondary | ICD-10-CM

## 2019-11-13 DIAGNOSIS — R531 Weakness: Secondary | ICD-10-CM | POA: Insufficient documentation

## 2019-11-13 DIAGNOSIS — E860 Dehydration: Secondary | ICD-10-CM | POA: Insufficient documentation

## 2019-11-13 DIAGNOSIS — I10 Essential (primary) hypertension: Secondary | ICD-10-CM | POA: Insufficient documentation

## 2019-11-13 DIAGNOSIS — R110 Nausea: Secondary | ICD-10-CM | POA: Insufficient documentation

## 2019-11-13 DIAGNOSIS — M7918 Myalgia, other site: Secondary | ICD-10-CM | POA: Insufficient documentation

## 2019-11-13 DIAGNOSIS — R05 Cough: Secondary | ICD-10-CM | POA: Insufficient documentation

## 2019-11-13 DIAGNOSIS — Z87891 Personal history of nicotine dependence: Secondary | ICD-10-CM | POA: Insufficient documentation

## 2019-11-13 DIAGNOSIS — Z79899 Other long term (current) drug therapy: Secondary | ICD-10-CM | POA: Insufficient documentation

## 2019-11-13 DIAGNOSIS — U071 COVID-19: Secondary | ICD-10-CM | POA: Insufficient documentation

## 2019-11-13 LAB — URINALYSIS, ROUTINE W REFLEX MICROSCOPIC
Bacteria, UA: NONE SEEN
Bilirubin Urine: NEGATIVE
Glucose, UA: NEGATIVE mg/dL
Hgb urine dipstick: NEGATIVE
Ketones, ur: 5 mg/dL — AB
Leukocytes,Ua: NEGATIVE
Nitrite: NEGATIVE
Protein, ur: >=300 mg/dL — AB
Specific Gravity, Urine: 1.022 (ref 1.005–1.030)
pH: 6 (ref 5.0–8.0)

## 2019-11-13 LAB — CBC
HCT: 42.2 % (ref 39.0–52.0)
Hemoglobin: 14.2 g/dL (ref 13.0–17.0)
MCH: 30.2 pg (ref 26.0–34.0)
MCHC: 33.6 g/dL (ref 30.0–36.0)
MCV: 89.8 fL (ref 80.0–100.0)
Platelets: 249 10*3/uL (ref 150–400)
RBC: 4.7 MIL/uL (ref 4.22–5.81)
RDW: 11.9 % (ref 11.5–15.5)
WBC: 7.4 10*3/uL (ref 4.0–10.5)
nRBC: 0 % (ref 0.0–0.2)

## 2019-11-13 LAB — BASIC METABOLIC PANEL
Anion gap: 12 (ref 5–15)
BUN: 15 mg/dL (ref 6–20)
CO2: 23 mmol/L (ref 22–32)
Calcium: 8.6 mg/dL — ABNORMAL LOW (ref 8.9–10.3)
Chloride: 100 mmol/L (ref 98–111)
Creatinine, Ser: 2.35 mg/dL — ABNORMAL HIGH (ref 0.61–1.24)
GFR calc Af Amer: 40 mL/min — ABNORMAL LOW (ref >60)
GFR calc non Af Amer: 35 mL/min — ABNORMAL LOW (ref >60)
Glucose, Bld: 104 mg/dL — ABNORMAL HIGH (ref 70–99)
Potassium: 3.6 mmol/L (ref 3.5–5.1)
Sodium: 135 mmol/L (ref 135–145)

## 2019-11-13 LAB — CBG MONITORING, ED: Glucose-Capillary: 94 mg/dL (ref 70–99)

## 2019-11-13 LAB — SARS CORONAVIRUS 2 (TAT 6-24 HRS): SARS Coronavirus 2: POSITIVE — AB

## 2019-11-13 MED ORDER — SODIUM CHLORIDE 0.9% FLUSH
3.0000 mL | Freq: Once | INTRAVENOUS | Status: AC
  Administered 2019-11-13: 3 mL via INTRAVENOUS

## 2019-11-13 MED ORDER — SODIUM CHLORIDE 0.9 % IV BOLUS
1000.0000 mL | Freq: Once | INTRAVENOUS | Status: AC
  Administered 2019-11-13: 1000 mL via INTRAVENOUS

## 2019-11-13 MED ORDER — ACETAMINOPHEN 325 MG PO TABS
650.0000 mg | ORAL_TABLET | Freq: Once | ORAL | Status: AC | PRN
  Administered 2019-11-13: 650 mg via ORAL
  Filled 2019-11-13: qty 2

## 2019-11-13 MED ORDER — ONDANSETRON HCL 4 MG/2ML IJ SOLN
4.0000 mg | Freq: Once | INTRAMUSCULAR | Status: AC
  Administered 2019-11-13: 18:00:00 4 mg via INTRAVENOUS
  Filled 2019-11-13: qty 2

## 2019-11-13 MED ORDER — ONDANSETRON 4 MG PO TBDP: 4.0000 mg | ORAL_TABLET | Freq: Three times a day (TID) | ORAL | 0 refills | Status: DC | PRN

## 2019-11-13 NOTE — ED Triage Notes (Signed)
Pt here for evaluation for fever x5 days, weakness, and loss of appetite. Endorses a syncopal episode this morning when attempting to have a bowel movement.  Denies falling.  States significant other helped him to bed.

## 2019-11-13 NOTE — ED Notes (Signed)
CBG patient results 94mg /dL
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
## 2019-11-13 NOTE — ED Notes (Signed)
Patient verbalizes understanding of discharge instructions. Opportunity for questioning and answers were provided. pt discharged from ED. Ambulatory by self  

## 2019-11-13 NOTE — Discharge Instructions (Signed)
I have prescribed you nausea medication called Zofran.  Your COVID test is pending; you should expect results in 2-3 days. You can access your results on your MyChart--if you test positive you should receive a phone call.  In the meantime follow CDC guidelines and quarantine, wear a mask, wash hands often.   Please take over the counter vitamin D 2000-4000 units per day. I also recommend zinc 50 mg per day for the next two weeks.   Please return to ED if you feel have difficulty breathing or have emergent, new or concerning symptoms.  Patients who have symptoms consistent with COVID-19 should self isolated for: At least 3 days (72 hours) have passed since recovery, defined as resolution of fever without the use of fever reducing medications and improvement in respiratory symptoms (e.g., cough, shortness of breath), and At least 7 days have passed since symptoms first appeared.       Person Under Monitoring Name: Nathan Heath  Location: [address] [place] [address]   Infection Prevention Recommendations for Individuals Confirmed to have, or Being Evaluated for, 2019 Novel Coronavirus (COVID-19) Infection Who Receive Care at Home  Individuals who are confirmed to have, or are being evaluated for, COVID-19 should follow the prevention steps below until a healthcare provider or local or state health department says they can return to normal activities.  Stay home except to get medical care You should restrict activities outside your home, except for getting medical care. Do not go to work, school, or public areas, and do not use public transportation or taxis.  Call ahead before visiting your doctor Before your medical appointment, call the healthcare provider and tell them that you have, or are being evaluated for, COVID-19 infection. This will help the healthcare provider's office take steps to keep other people from getting infected. Ask your healthcare provider to call  the local or state health department.  Monitor your symptoms Seek prompt medical attention if your illness is worsening (e.g., difficulty breathing). Before going to your medical appointment, call the healthcare provider and tell them that you have, or are being evaluated for, COVID-19 infection. Ask your healthcare provider to call the local or state health department.  Wear a facemask You should wear a facemask that covers your nose and mouth when you are in the same room with other people and when you visit a healthcare provider. People who live with or visit you should also wear a facemask while they are in the same room with you.  Separate yourself from other people in your home As much as possible, you should stay in a different room from other people in your home. Also, you should use a separate bathroom, if available.  Avoid sharing household items You should not share dishes, drinking glasses, cups, eating utensils, towels, bedding, or other items with other people in your home. After using these items, you should wash them thoroughly with soap and water.  Cover your coughs and sneezes Cover your mouth and nose with a tissue when you cough or sneeze, or you can cough or sneeze into your sleeve. Throw used tissues in a lined trash can, and immediately wash your hands with soap and water for at least 20 seconds or use an alcohol-based hand rub.  Wash your [employer] your hands often and thoroughly with soap and water for at least 20 seconds. You can use an alcohol-based hand sanitizer if soap and water are not available and if your hands are not  visibly dirty. Avoid touching your eyes, nose, and mouth with unwashed hands.   Prevention Steps for Caregivers and Household Members of Individuals Confirmed to have, or Being Evaluated for, COVID-19 Infection Being Cared for in the Home  If you live with, or provide care at home for, a person confirmed to have, or being evaluated  for, COVID-19 infection please follow these guidelines to prevent infection:  Follow healthcare provider's instructions Make sure that you understand and can help the patient follow any healthcare provider instructions for all care.  Provide for the patient's basic needs You should help the patient with basic needs in the home and provide support for getting groceries, prescriptions, and other personal needs.  Monitor the patient's symptoms If they are getting sicker, call his or her medical provider and tell them that the patient has, or is being evaluated for, COVID-19 infection. This will help the healthcare provider's office take steps to keep other people from getting infected. Ask the healthcare provider to call the local or state health department.  Limit the number of people who have contact with the patient If possible, have only one caregiver for the patient. Other household members should stay in another home or place of residence. If this is not possible, they should stay in another room, or be separated from the patient as much as possible. Use a separate bathroom, if available. Restrict visitors who do not have an essential need to be in the home.  Keep older adults, very young children, and other sick people away from the patient Keep older adults, very young children, and those who have compromised immune systems or chronic health conditions away from the patient. This includes people with chronic heart, lung, or kidney conditions, diabetes, and cancer.  Ensure good ventilation Make sure that shared spaces in the home have good air flow, such as from an air conditioner or an opened window, weather permitting.  Wash your hands often Wash your hands often and thoroughly with soap and water for at least 20 seconds. You can use an alcohol based hand sanitizer if soap and water are not available and if your hands are not visibly dirty. Avoid touching your eyes, nose, and  mouth with unwashed hands. Use disposable paper towels to dry your hands. If not available, use dedicated cloth towels and replace them when they become wet.  Wear a facemask and gloves Wear a disposable facemask at all times in the room and gloves when you touch or have contact with the patient's blood, body fluids, and/or secretions or excretions, such as sweat, saliva, sputum, nasal mucus, vomit, urine, or feces.  Ensure the mask fits over your nose and mouth tightly, and do not touch it during use. Throw out disposable facemasks and gloves after using them. Do not reuse. Wash your hands immediately after removing your facemask and gloves. If your personal clothing becomes contaminated, carefully remove clothing and launder. Wash your hands after handling contaminated clothing. Place all used disposable facemasks, gloves, and other waste in a lined container before disposing them with other household waste. Remove gloves and wash your hands immediately after handling these items.  Do not share dishes, glasses, or other household items with the patient Avoid sharing household items. You should not share dishes, drinking glasses, cups, eating utensils, towels, bedding, or other items with a patient who is confirmed to have, or being evaluated for, COVID-19 infection. After the person uses these items, you should wash them thoroughly with soap and water.  Wash laundry thoroughly Immediately remove and wash clothes or bedding that have blood, body fluids, and/or secretions or excretions, such as sweat, saliva, sputum, nasal mucus, vomit, urine, or feces, on them. Wear gloves when handling laundry from the patient. Read and follow directions on labels of laundry or clothing items and detergent. In general, wash and dry with the warmest temperatures recommended on the label.  Clean all areas the individual has used often Clean all touchable surfaces, such as counters, tabletops, doorknobs, bathroom  fixtures, toilets, phones, keyboards, tablets, and bedside tables, every day. Also, clean any surfaces that may have blood, body fluids, and/or secretions or excretions on them. Wear gloves when cleaning surfaces the patient has come in contact with. Use a diluted bleach solution (e.g., dilute bleach with 1 part bleach and 10 parts water) or a household disinfectant with a label that says EPA-registered for coronaviruses. To make a bleach solution at home, add 1 tablespoon of bleach to 1 quart (4 cups) of water. For a larger supply, add  cup of bleach to 1 gallon (16 cups) of water. Read labels of cleaning products and follow recommendations provided on product labels. Labels contain instructions for safe and effective use of the cleaning product including precautions you should take when applying the product, such as wearing gloves or eye protection and making sure you have good ventilation during use of the product. Remove gloves and wash hands immediately after cleaning.  Monitor yourself for signs and symptoms of illness Caregivers and household members are considered close contacts, should monitor their health, and will be asked to limit movement outside of the home to the extent possible. Follow the monitoring steps for close contacts listed on the symptom monitoring form.   ? If you have additional questions, contact your local health department or call the epidemiologist on call at [phone] (available 24/7). ? This guidance is subject to change. For the most up-to-date guidance from [facility], please refer to their website: [email or web address]

## 2019-11-24 ENCOUNTER — Encounter (HOSPITAL_COMMUNITY): Payer: Self-pay | Admitting: *Deleted

## 2019-11-24 ENCOUNTER — Other Ambulatory Visit: Payer: Self-pay

## 2019-11-24 ENCOUNTER — Emergency Department (HOSPITAL_COMMUNITY)
Admission: EM | Admit: 2019-11-24 | Discharge: 2019-11-24 | Disposition: A | Discharge status: Home / Self Care | Payer: Self-pay | Attending: Emergency Medicine | Admitting: Emergency Medicine

## 2019-11-24 DIAGNOSIS — Z79899 Other long term (current) drug therapy: Secondary | ICD-10-CM | POA: Insufficient documentation

## 2019-11-24 DIAGNOSIS — R42 Dizziness and giddiness: Secondary | ICD-10-CM | POA: Insufficient documentation

## 2019-11-24 DIAGNOSIS — I10 Essential (primary) hypertension: Secondary | ICD-10-CM | POA: Insufficient documentation

## 2019-11-24 DIAGNOSIS — Z87891 Personal history of nicotine dependence: Secondary | ICD-10-CM | POA: Insufficient documentation

## 2019-11-24 LAB — CBC
HCT: 39.6 % (ref 39.0–52.0)
Hemoglobin: 12.8 g/dL — ABNORMAL LOW (ref 13.0–17.0)
MCH: 29.6 pg (ref 26.0–34.0)
MCHC: 32.3 g/dL (ref 30.0–36.0)
MCV: 91.7 fL (ref 80.0–100.0)
Platelets: 686 10*3/uL — ABNORMAL HIGH (ref 150–400)
RBC: 4.32 MIL/uL (ref 4.22–5.81)
RDW: 11.8 % (ref 11.5–15.5)
WBC: 7.6 10*3/uL (ref 4.0–10.5)
nRBC: 0 % (ref 0.0–0.2)

## 2019-11-24 LAB — BASIC METABOLIC PANEL
Anion gap: 9 (ref 5–15)
BUN: 15 mg/dL (ref 6–20)
CO2: 25 mmol/L (ref 22–32)
Calcium: 9.2 mg/dL (ref 8.9–10.3)
Chloride: 105 mmol/L (ref 98–111)
Creatinine, Ser: 1.72 mg/dL — ABNORMAL HIGH (ref 0.61–1.24)
GFR calc Af Amer: 59 mL/min — ABNORMAL LOW (ref >60)
GFR calc non Af Amer: 51 mL/min — ABNORMAL LOW (ref >60)
Glucose, Bld: 108 mg/dL — ABNORMAL HIGH (ref 70–99)
Potassium: 4.2 mmol/L (ref 3.5–5.1)
Sodium: 139 mmol/L (ref 135–145)

## 2019-11-24 LAB — URINALYSIS, ROUTINE W REFLEX MICROSCOPIC
Bacteria, UA: NONE SEEN
Bilirubin Urine: NEGATIVE
Glucose, UA: NEGATIVE mg/dL
Hgb urine dipstick: NEGATIVE
Ketones, ur: NEGATIVE mg/dL
Leukocytes,Ua: NEGATIVE
Nitrite: NEGATIVE
Protein, ur: 100 mg/dL — AB
Specific Gravity, Urine: 1.026 (ref 1.005–1.030)
pH: 6 (ref 5.0–8.0)

## 2019-11-24 MED ORDER — LISINOPRIL 10 MG PO TABS: 10.0000 mg | ORAL_TABLET | Freq: Every day | ORAL | 0 refills | Status: DC

## 2019-11-24 MED ORDER — SODIUM CHLORIDE 0.9% FLUSH: 3.0000 mL | Freq: Once | INTRAVENOUS | Status: DC

## 2019-11-24 MED ORDER — LISINOPRIL 10 MG PO TABS
10.0000 mg | ORAL_TABLET | Freq: Once | ORAL | Status: AC
  Administered 2019-11-24: 10 mg via ORAL
  Filled 2019-11-24: qty 1

## 2019-11-24 MED ORDER — MECLIZINE HCL 25 MG PO TABS: 25.0000 mg | ORAL_TABLET | Freq: Three times a day (TID) | ORAL | 0 refills | Status: DC | PRN

## 2019-11-24 MED ORDER — MECLIZINE HCL 25 MG PO TABS
25.0000 mg | ORAL_TABLET | Freq: Once | ORAL | Status: AC
  Administered 2019-11-24: 14:00:00 25 mg via ORAL
  Filled 2019-11-24: qty 1

## 2019-11-24 NOTE — ED Notes (Signed)
Please call for updates roberta [phone] grandmom
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
## 2019-11-24 NOTE — Discharge Instructions (Addendum)
Please take meclizine as needed for your symptoms as prescribed.  Please use lisinopril daily as prescribed.  Please recheck your blood pressure and follow-up with your primary care doctor for recheck.  Please consider following up with ENT I have attached information above if you continue to experience symptoms.

## 2019-11-24 NOTE — ED Notes (Signed)
ED Provider at bedside. 

## 2019-11-24 NOTE — ED Notes (Signed)
Patient verbalizes understanding of discharge instructions. Opportunity for questioning and answers were provided. Armband removed by staff, pt discharged from ED.  

## 2019-11-24 NOTE — ED Triage Notes (Signed)
Pt reports recent covid, tested + on 12/25. Had fever until 3 days ago. Woke up this am feeling lightheaded and dizzy.

## 2021-07-09 IMAGING — CR DG CHEST 2V
2 series · 2 of 2 positions shown · non-contrast
Comparison: 01/02/2009

CLINICAL DATA: Generalized pain. Chest pain and congestion. Fever.

EXAM:
CHEST - 2 VIEW

[chest lat]
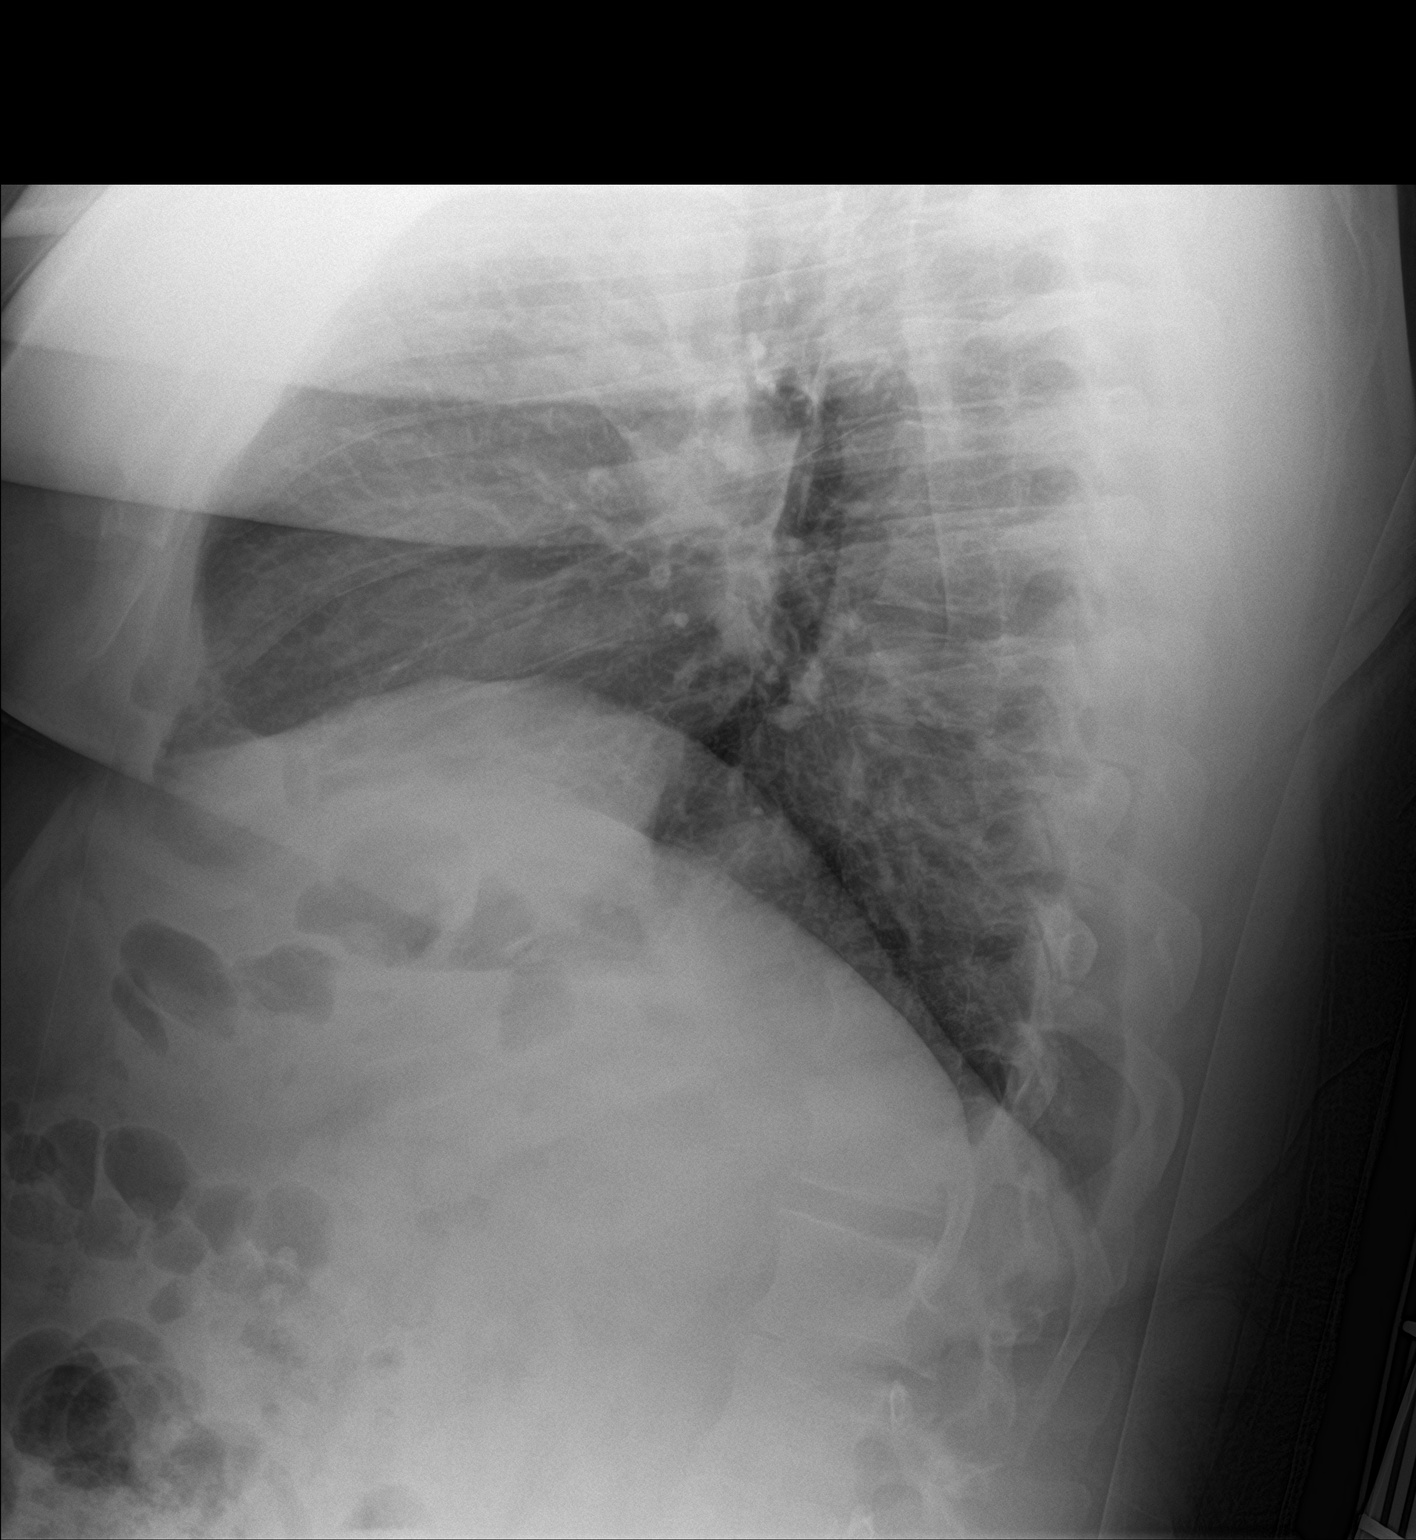

[chest ap]
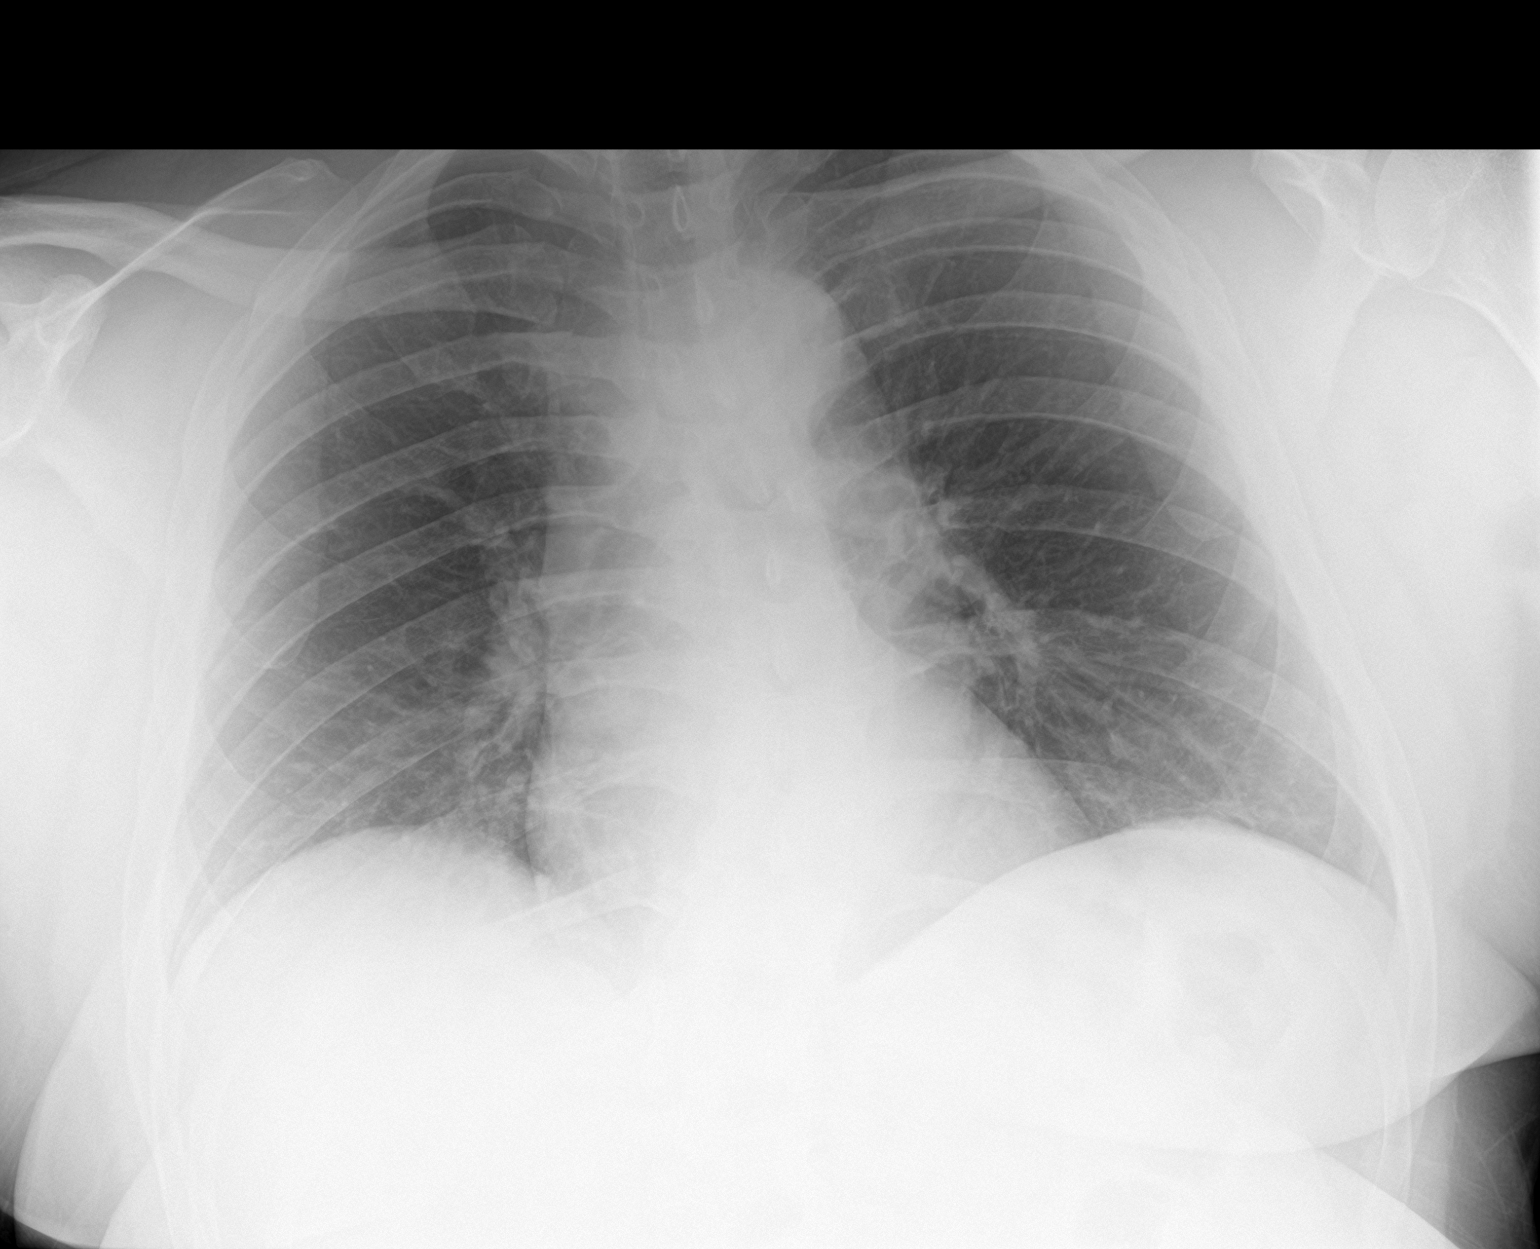

[2 of 2 positions shown; findings below may reference images not displayed]

FINDINGS: 3305 hours. The lungs are clear without focal pneumonia, edema,
pneumothorax or pleural effusion. Streaky opacity at the bases
likely atelectasis. The cardiopericardial silhouette is within
normal limits for size. The visualized bony structures of the thorax
are intact.
IMPRESSION: Bibasilar streaky opacity suggests atelectasis. Otherwise no acute
cardiopulmonary findings.

## 2022-06-18 ENCOUNTER — Other Ambulatory Visit: Payer: Self-pay

## 2022-06-18 ENCOUNTER — Emergency Department (HOSPITAL_BASED_OUTPATIENT_CLINIC_OR_DEPARTMENT_OTHER)
Admission: EM | Admit: 2022-06-18 | Discharge: 2022-06-18 | Disposition: A | Discharge status: Home / Self Care | Payer: Self-pay | Attending: Emergency Medicine | Admitting: Emergency Medicine

## 2022-06-18 ENCOUNTER — Encounter (HOSPITAL_BASED_OUTPATIENT_CLINIC_OR_DEPARTMENT_OTHER): Payer: Self-pay | Admitting: Emergency Medicine

## 2022-06-18 ENCOUNTER — Emergency Department (HOSPITAL_BASED_OUTPATIENT_CLINIC_OR_DEPARTMENT_OTHER): Payer: Self-pay

## 2022-06-18 DIAGNOSIS — Z79899 Other long term (current) drug therapy: Secondary | ICD-10-CM | POA: Insufficient documentation

## 2022-06-18 DIAGNOSIS — R519 Headache, unspecified: Secondary | ICD-10-CM | POA: Insufficient documentation

## 2022-06-18 DIAGNOSIS — I10 Essential (primary) hypertension: Secondary | ICD-10-CM | POA: Insufficient documentation

## 2022-06-18 DIAGNOSIS — R42 Dizziness and giddiness: Secondary | ICD-10-CM | POA: Insufficient documentation

## 2022-06-18 LAB — CBC WITH DIFFERENTIAL/PLATELET
Abs Immature Granulocytes: 0.02 10*3/uL (ref 0.00–0.07)
Basophils Absolute: 0 10*3/uL (ref 0.0–0.1)
Basophils Relative: 0 %
Eosinophils Absolute: 0.1 10*3/uL (ref 0.0–0.5)
Eosinophils Relative: 1 %
HCT: 39.3 % (ref 39.0–52.0)
Hemoglobin: 13.4 g/dL (ref 13.0–17.0)
Immature Granulocytes: 0 %
Lymphocytes Relative: 39 %
Lymphs Abs: 3.8 10*3/uL (ref 0.7–4.0)
MCH: 30.1 pg (ref 26.0–34.0)
MCHC: 34.1 g/dL (ref 30.0–36.0)
MCV: 88.3 fL (ref 80.0–100.0)
Monocytes Absolute: 0.6 10*3/uL (ref 0.1–1.0)
Monocytes Relative: 6 %
Neutro Abs: 5.4 10*3/uL (ref 1.7–7.7)
Neutrophils Relative %: 54 %
Platelets: 411 10*3/uL — ABNORMAL HIGH (ref 150–400)
RBC: 4.45 MIL/uL (ref 4.22–5.81)
RDW: 12.4 % (ref 11.5–15.5)
WBC: 9.9 10*3/uL (ref 4.0–10.5)
nRBC: 0 % (ref 0.0–0.2)

## 2022-06-18 LAB — TROPONIN I (HIGH SENSITIVITY)
Troponin I (High Sensitivity): 22 ng/L — ABNORMAL HIGH (ref <18)
Troponin I (High Sensitivity): 22 ng/L — ABNORMAL HIGH (ref <18)

## 2022-06-18 LAB — BASIC METABOLIC PANEL
Anion gap: 9 (ref 5–15)
BUN: 26 mg/dL — ABNORMAL HIGH (ref 6–20)
CO2: 29 mmol/L (ref 22–32)
Calcium: 9.1 mg/dL (ref 8.9–10.3)
Chloride: 100 mmol/L (ref 98–111)
Creatinine, Ser: 2.22 mg/dL — ABNORMAL HIGH (ref 0.61–1.24)
GFR, Estimated: 38 mL/min — ABNORMAL LOW (ref >60)
Glucose, Bld: 122 mg/dL — ABNORMAL HIGH (ref 70–99)
Potassium: 2.8 mmol/L — ABNORMAL LOW (ref 3.5–5.1)
Sodium: 138 mmol/L (ref 135–145)

## 2022-06-18 MED ORDER — AMLODIPINE BESYLATE 5 MG PO TABS
10.0000 mg | ORAL_TABLET | Freq: Once | ORAL | Status: AC
  Administered 2022-06-18: 10 mg via ORAL
  Filled 2022-06-18: qty 2

## 2022-06-18 MED ORDER — HYDRALAZINE HCL 25 MG PO TABS
25.0000 mg | ORAL_TABLET | Freq: Once | ORAL | Status: AC
  Administered 2022-06-18: 25 mg via ORAL
  Filled 2022-06-18: qty 1

## 2022-06-18 MED ORDER — HYDRALAZINE HCL 10 MG PO TABS
10.0000 mg | ORAL_TABLET | Freq: Once | ORAL | Status: AC
  Administered 2022-06-18: 10 mg via ORAL
  Filled 2022-06-18: qty 1

## 2022-06-18 MED ORDER — MECLIZINE HCL 25 MG PO TABS
25.0000 mg | ORAL_TABLET | Freq: Once | ORAL | Status: AC
  Administered 2022-06-18: 25 mg via ORAL
  Filled 2022-06-18: qty 1

## 2022-06-18 MED ORDER — AMLODIPINE BESYLATE 5 MG PO TABS: 5.0000 mg | ORAL_TABLET | Freq: Every day | ORAL | 0 refills | Status: DC

## 2022-06-18 MED ORDER — MECLIZINE HCL 25 MG PO TABS: 25.0000 mg | ORAL_TABLET | Freq: Three times a day (TID) | ORAL | 0 refills | Status: DC | PRN

## 2022-06-18 NOTE — ED Provider Notes (Signed)
[facility] EMERGENCY DEPARTMENT Provider Note   CSN: [id number] Arrival date & time: 06/18/22  9323     History  Chief Complaint  Patient presents with   Dizziness    Nathan Heath is a 36 y.o. male.  Presented to the emergency room due to concern for dizziness.  Reports that he is having dizziness, room spinning sensation for the past 2 days.  Worse with certain movements, worse when he turns to the side or gets up suddenly.  Has been able to walk, no issues walking.  Had more slight dizziness over the past few days to week.  Last week he did have an episode of a bad headache.  Was not sudden onset not worsening of his life but was quite severe.  Frontal.  States that he is out of his blood pressure medications.  Normally takes amlodipine.  No chest pain, difficulty in breathing.  No headache right now.  HPI     Home Medications Prior to Admission medications   Medication Sig Start Date End Date Taking? Authorizing Provider  hydrochlorothiazide (HYDRODIURIL) 25 MG tablet Take 1 tablet (25 mg total) by mouth daily. 04/19/19  Yes Charlynne Pander, MD  amLODipine (NORVASC) 5 MG tablet Take 1 tablet (5 mg total) by mouth daily. 06/18/22   Milagros Loll, MD  azithromycin (ZITHROMAX) 250 MG tablet Take 1 tablet (250 mg total) by mouth daily. Take first 2 tablets together, then 1 every day until finished. 11/29/16   Deatra Canter, FNP  ipratropium (ATROVENT) 0.02 % nebulizer solution Take 2.5 mLs (0.5 mg total) by nebulization 4 (four) times daily. 11/29/16   Deatra Canter, FNP  lisinopril (ZESTRIL) 10 MG tablet Take 1 tablet (10 mg total) by mouth daily. 11/24/19   Gailen Shelter, PA  meclizine (ANTIVERT) 25 MG tablet Take 1 tablet (25 mg total) by mouth 3 (three) times daily as needed for dizziness. 06/18/22   Milagros Loll, MD  ondansetron (ZOFRAN ODT) 4 MG disintegrating tablet Take 1 tablet (4 mg total) by mouth every 8 (eight) hours as needed for nausea or  vomiting. 11/13/19   Gailen Shelter, PA  predniSONE (STERAPRED UNI-PAK 21 TAB) 10 MG (21) TBPK tablet Take by mouth daily. Take as directed. 07/29/19   Mardella Layman, MD  Azilsartan-Chlorthalidone (EDARBYCLOR) 40-12.5 MG TABS Take by mouth.  07/29/19  [provider]      Allergies    Patient has no known allergies.    Review of Systems   Review of Systems  Constitutional:  Negative for chills and fever.  HENT:  Negative for ear pain and sore throat.   Eyes:  Negative for pain and visual disturbance.  Respiratory:  Negative for cough and shortness of breath.   Cardiovascular:  Negative for chest pain and palpitations.  Gastrointestinal:  Negative for abdominal pain and vomiting.  Genitourinary:  Negative for dysuria and hematuria.  Musculoskeletal:  Negative for arthralgias and back pain.  Skin:  Negative for color change and rash.  Neurological:  Positive for dizziness and headaches. Negative for seizures and syncope.  All other systems reviewed and are negative.   Physical Exam Updated Vital Signs BP (!) 166/121   Pulse 73   Temp 97.9 F (36.6 C) (Oral)   Resp 18   SpO2 98%  Physical Exam Vitals and nursing note reviewed.  Constitutional:      General: He is not in acute distress.    Appearance: He is well-developed.  HENT:     Head: Normocephalic and atraumatic.  Eyes:     Conjunctiva/sclera: Conjunctivae normal.  Cardiovascular:     Rate and Rhythm: Normal rate and regular rhythm.     Heart sounds: No murmur heard. Pulmonary:     Effort: Pulmonary effort is normal. No respiratory distress.     Breath sounds: Normal breath sounds.  Abdominal:     Palpations: Abdomen is soft.     Tenderness: There is no abdominal tenderness.  Musculoskeletal:        General: No swelling or deformity.     Cervical back: Neck supple.  Skin:    General: Skin is warm and dry.     Capillary Refill: Capillary refill takes less than 2 seconds.  Neurological:     General: No  focal deficit present.     Mental Status: He is alert and oriented to person, place, and time.  Psychiatric:        Mood and Affect: Mood normal.     ED Results / Procedures / Treatments   Labs (all labs ordered are listed, but only abnormal results are displayed) Labs Reviewed  CBC WITH DIFFERENTIAL/PLATELET - Abnormal; Notable for the following components:      Result Value   Platelets 411 (*)    All other components within normal limits  BASIC METABOLIC PANEL - Abnormal; Notable for the following components:   Potassium 2.8 (*)    Glucose, Bld 122 (*)    BUN 26 (*)    Creatinine, Ser 2.22 (*)    GFR, Estimated 38 (*)    All other components within normal limits  TROPONIN I (HIGH SENSITIVITY) - Abnormal; Notable for the following components:   Troponin I (High Sensitivity) 22 (*)    All other components within normal limits  TROPONIN I (HIGH SENSITIVITY) - Abnormal; Notable for the following components:   Troponin I (High Sensitivity) 22 (*)    All other components within normal limits    EKG EKG Interpretation  Date/Time:  Sunday June 18 2022 08:21:47 EDT Ventricular Rate:  84 PR Interval:  163 QRS Duration: 93 QT Interval:  368 QTC Calculation: 435 R Axis:   28 Text Interpretation: Sinus rhythm LVH with secondary repolarization abnormality Confirmed by Marianna Fuss ([id number]) on 06/18/2022 9:38:11 AM  Radiology CT Head Wo Contrast  Result Date: 06/18/2022 CLINICAL DATA:  Headache, sudden, severe EXAM: CT HEAD WITHOUT CONTRAST TECHNIQUE: Contiguous axial images were obtained from the base of the skull through the vertex without intravenous contrast. RADIATION DOSE REDUCTION: This exam was performed according to the departmental dose-optimization program which includes automated exposure control, adjustment of the mA and/or kV according to patient size and/or use of iterative reconstruction technique. COMPARISON:  09/16/2006 FINDINGS: Brain: No evidence of acute  infarction, hemorrhage, hydrocephalus, extra-axial collection or mass lesion/mass effect. Vascular: No hyperdense vessel or unexpected calcification. Skull: Normal. Negative for fracture or focal lesion. Sinuses/Orbits: No acute finding. Other: None IMPRESSION: Negative Electronically Signed   By: Corlis Leak M.D.   On: 06/18/2022 09:23    Procedures Procedures    Medications Ordered in ED Medications  meclizine (ANTIVERT) tablet 25 mg (25 mg Oral Given 06/18/22 0846)  amLODipine (NORVASC) tablet 10 mg (10 mg Oral Given 06/18/22 0846)  hydrALAZINE (APRESOLINE) tablet 10 mg (10 mg Oral Given 06/18/22 0846)  hydrALAZINE (APRESOLINE) tablet 25 mg (25 mg Oral Given 06/18/22 1116)    ED Course/ Medical Decision Making/ A&P  Medical Decision Making Amount and/or Complexity of Data Reviewed Labs: ordered. Radiology: ordered.  Risk Prescription drug management.   36 year old gentleman presenting to the emergency room due to concern for dizziness over the past couple days.  He also endorsed having a headache last week that was bad.  Currently he appears well in no distress.  His initial blood pressure was noted to be quite elevated.  Check basic labs, EKG, CT head given the hypertension and headache report.  Provided home blood pressure medicine as well as some additional p.o. hydralazine.  CT head is negative.  Basic labs are stable.  Troponin is slightly elevated, repeat troponin however stable.  In the absence of chest pain or other specific ACS symptoms, doubt ACS.  I recommended patient follow-up closely with his primary care doctor to discuss blood pressure regimen further.  Provided Rx for his amlodipine that he reports that he ran out of.  Patient denies any ongoing symptoms at this time, given his well appearance, improvement in blood pressure, feel he can be discharged and managed in the outpatient basis.  After the discussed management above, the patient was  determined to be safe for discharge.  The patient was in agreement with this plan and all questions regarding their care were answered.  ED return precautions were discussed and the patient will return to the ED with any significant worsening of condition.          Final Clinical Impression(s) / ED Diagnoses Final diagnoses:  Hypertension, unspecified type  Vertigo    Rx / DC Orders ED Discharge Orders          Ordered    meclizine (ANTIVERT) 25 MG tablet  3 times daily PRN        06/18/22 1258    amLODipine (NORVASC) 5 MG tablet  Daily        06/18/22 1313              Milagros Loll, MD 06/18/22 1517

## 2022-06-18 NOTE — ED Notes (Signed)
Dc instructions and scripts reviewed with pt. No questions or concerns at this time. Will follow up with pcp

## 2022-06-18 NOTE — ED Triage Notes (Addendum)
Pt reports feeling like he has vertigo for the past 2 days. No shortness of breath or chest pain. Hx of HTN, and feels like symptoms are similar to when he has high BP.  Ran out of BP medication a couple weeks ago.

## 2022-06-18 NOTE — ED Notes (Signed)
ED Provider at bedside. 

## 2022-06-18 NOTE — Discharge Instructions (Signed)
Please follow-up with your primary care doctor to discuss your blood pressure regimen.  Can take the meclizine as needed.  Come back to ER if you have chest pain, difficulty breathing, severe HA or other new concerning symptom.

## 2024-03-12 ENCOUNTER — Encounter (HOSPITAL_COMMUNITY): Payer: Self-pay

## 2024-03-12 ENCOUNTER — Ambulatory Visit (HOSPITAL_COMMUNITY)
Admission: EM | Admit: 2024-03-12 | Discharge: 2024-03-12 | Disposition: A | Discharge status: Home / Self Care | Payer: Self-pay | Attending: Family Medicine | Admitting: Family Medicine

## 2024-03-12 DIAGNOSIS — J029 Acute pharyngitis, unspecified: Secondary | ICD-10-CM

## 2024-03-12 HISTORY — DX: Sleep apnea, unspecified: G47.30

## 2024-03-12 MED ORDER — AMLODIPINE BESYLATE 5 MG PO TABS: 5.0000 mg | ORAL_TABLET | Freq: Every day | ORAL | 1 refills | Status: DC

## 2024-03-12 MED ORDER — TRIAMCINOLONE ACETONIDE 40 MG/ML IJ SUSP
40.0000 mg | Freq: Once | INTRAMUSCULAR | Status: AC
  Administered 2024-03-12: 40 mg via INTRAMUSCULAR

## 2024-03-12 MED ORDER — TRIAMCINOLONE ACETONIDE 40 MG/ML IJ SUSP
INTRAMUSCULAR | Status: AC
  Filled 2024-03-12: qty 1

## 2024-03-12 NOTE — Discharge Instructions (Addendum)
 I have given you a steroid shot here today for throat inflammation and swelling.  You need to make sure you are taking your BP medication daily. Please take when you get home. You may have to increase to 10 mg of Amlodipine  due to the steroid.  I would recommend starting on a antihistamine  such as Zyrtec daily.  You  can do Benadryl at night. I have refilled your blood pressure medication as requested.  You need to call your doctor for follow-up

## 2024-03-12 NOTE — ED Triage Notes (Signed)
 Patient reports that he has been having a sore throat and difficulty swallowing that is worse at night. Patient states he wears a CPAP at night and at times have felt like his throat was closing.  Patient states he has been taking Taking Hall's sore throat lozenges.
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
## 2024-07-27 ENCOUNTER — Emergency Department (HOSPITAL_BASED_OUTPATIENT_CLINIC_OR_DEPARTMENT_OTHER): Payer: Self-pay

## 2024-07-27 ENCOUNTER — Other Ambulatory Visit: Payer: Self-pay

## 2024-07-27 ENCOUNTER — Inpatient Hospital Stay (HOSPITAL_BASED_OUTPATIENT_CLINIC_OR_DEPARTMENT_OTHER)
Admission: EM | Admit: 2024-07-27 | Discharge: 2024-08-07 | DRG: 291 | Disposition: A | Discharge status: Home / Self Care | Payer: MEDICAID | Attending: Internal Medicine | Admitting: Internal Medicine

## 2024-07-27 DIAGNOSIS — I161 Hypertensive emergency: Secondary | ICD-10-CM | POA: Diagnosis present

## 2024-07-27 DIAGNOSIS — I429 Cardiomyopathy, unspecified: Secondary | ICD-10-CM

## 2024-07-27 DIAGNOSIS — I5021 Acute systolic (congestive) heart failure: Secondary | ICD-10-CM | POA: Diagnosis present

## 2024-07-27 DIAGNOSIS — I509 Heart failure, unspecified: Principal | ICD-10-CM

## 2024-07-27 DIAGNOSIS — I169 Hypertensive crisis, unspecified: Secondary | ICD-10-CM

## 2024-07-27 DIAGNOSIS — E785 Hyperlipidemia, unspecified: Secondary | ICD-10-CM | POA: Diagnosis present

## 2024-07-27 DIAGNOSIS — E669 Obesity, unspecified: Secondary | ICD-10-CM | POA: Diagnosis present

## 2024-07-27 DIAGNOSIS — E876 Hypokalemia: Secondary | ICD-10-CM | POA: Diagnosis present

## 2024-07-27 DIAGNOSIS — R7989 Other specified abnormal findings of blood chemistry: Secondary | ICD-10-CM

## 2024-07-27 DIAGNOSIS — Z79899 Other long term (current) drug therapy: Secondary | ICD-10-CM

## 2024-07-27 DIAGNOSIS — F17290 Nicotine dependence, other tobacco product, uncomplicated: Secondary | ICD-10-CM | POA: Diagnosis present

## 2024-07-27 DIAGNOSIS — N179 Acute kidney failure, unspecified: Secondary | ICD-10-CM | POA: Diagnosis present

## 2024-07-27 DIAGNOSIS — R0609 Other forms of dyspnea: Secondary | ICD-10-CM

## 2024-07-27 DIAGNOSIS — I5041 Acute combined systolic (congestive) and diastolic (congestive) heart failure: Secondary | ICD-10-CM | POA: Diagnosis present

## 2024-07-27 DIAGNOSIS — D509 Iron deficiency anemia, unspecified: Secondary | ICD-10-CM | POA: Diagnosis present

## 2024-07-27 DIAGNOSIS — G4733 Obstructive sleep apnea (adult) (pediatric): Secondary | ICD-10-CM | POA: Diagnosis present

## 2024-07-27 DIAGNOSIS — E871 Hypo-osmolality and hyponatremia: Secondary | ICD-10-CM | POA: Diagnosis present

## 2024-07-27 DIAGNOSIS — D631 Anemia in chronic kidney disease: Secondary | ICD-10-CM | POA: Diagnosis present

## 2024-07-27 DIAGNOSIS — N1832 Chronic kidney disease, stage 3b: Secondary | ICD-10-CM | POA: Diagnosis present

## 2024-07-27 DIAGNOSIS — Z6829 Body mass index (BMI) 29.0-29.9, adult: Secondary | ICD-10-CM

## 2024-07-27 DIAGNOSIS — I493 Ventricular premature depolarization: Secondary | ICD-10-CM | POA: Diagnosis present

## 2024-07-27 DIAGNOSIS — I43 Cardiomyopathy in diseases classified elsewhere: Secondary | ICD-10-CM | POA: Diagnosis present

## 2024-07-27 DIAGNOSIS — I130 Hypertensive heart and chronic kidney disease with heart failure and stage 1 through stage 4 chronic kidney disease, or unspecified chronic kidney disease: Principal | ICD-10-CM | POA: Diagnosis present

## 2024-07-27 LAB — LACTIC ACID, PLASMA
Lactic Acid, Venous: 1.1 mmol/L (ref 0.5–1.9)
Lactic Acid, Venous: 1.1 mmol/L (ref 0.5–1.9)

## 2024-07-27 LAB — CBC
HCT: 29 % — ABNORMAL LOW (ref 39.0–52.0)
Hemoglobin: 9.8 g/dL — ABNORMAL LOW (ref 13.0–17.0)
MCH: 29 pg (ref 26.0–34.0)
MCHC: 33.8 g/dL (ref 30.0–36.0)
MCV: 85.8 fL (ref 80.0–100.0)
Platelets: 509 K/uL — ABNORMAL HIGH (ref 150–400)
RBC: 3.38 MIL/uL — ABNORMAL LOW (ref 4.22–5.81)
RDW: 12.9 % (ref 11.5–15.5)
WBC: 8.1 K/uL (ref 4.0–10.5)
nRBC: 0 % (ref 0.0–0.2)

## 2024-07-27 LAB — HEPATIC FUNCTION PANEL
ALT: 30 U/L (ref 0–44)
AST: 20 U/L (ref 15–41)
Albumin: 3.9 g/dL (ref 3.5–5.0)
Alkaline Phosphatase: 101 U/L (ref 38–126)
Bilirubin, Direct: 0.2 mg/dL (ref 0.0–0.2)
Indirect Bilirubin: 0.2 mg/dL — ABNORMAL LOW (ref 0.3–0.9)
Total Bilirubin: 0.4 mg/dL (ref 0.0–1.2)
Total Protein: 7.3 g/dL (ref 6.5–8.1)

## 2024-07-27 LAB — BASIC METABOLIC PANEL WITH GFR
Anion gap: 16 — ABNORMAL HIGH (ref 5–15)
BUN: 57 mg/dL — ABNORMAL HIGH (ref 6–20)
CO2: 24 mmol/L (ref 22–32)
Calcium: 9.4 mg/dL (ref 8.9–10.3)
Chloride: 96 mmol/L — ABNORMAL LOW (ref 98–111)
Creatinine, Ser: 6.34 mg/dL — ABNORMAL HIGH (ref 0.61–1.24)
GFR, Estimated: 11 mL/min — ABNORMAL LOW (ref >60)
Glucose, Bld: 114 mg/dL — ABNORMAL HIGH (ref 70–99)
Potassium: 3.3 mmol/L — ABNORMAL LOW (ref 3.5–5.1)
Sodium: 135 mmol/L (ref 135–145)

## 2024-07-27 LAB — GROUP A STREP BY PCR: Group A Strep by PCR: NOT DETECTED

## 2024-07-27 LAB — URINE DRUG SCREEN
Amphetamines: NEGATIVE
Barbiturates: NEGATIVE
Benzodiazepines: NEGATIVE
Cocaine: NEGATIVE
Fentanyl: NEGATIVE
Methadone Scn, Ur: NEGATIVE
Opiates: POSITIVE — AB
Tetrahydrocannabinol: NEGATIVE

## 2024-07-27 LAB — PRO BRAIN NATRIURETIC PEPTIDE: Pro Brain Natriuretic Peptide: 27428 pg/mL — ABNORMAL HIGH (ref <300.0)

## 2024-07-27 LAB — TROPONIN T, HIGH SENSITIVITY
Troponin T High Sensitivity: 138 ng/L (ref 0–19)
Troponin T High Sensitivity: 140 ng/L (ref 0–19)

## 2024-07-27 LAB — D-DIMER, QUANTITATIVE: D-Dimer, Quant: 0.34 ug{FEU}/mL (ref 0.00–0.50)

## 2024-07-27 MED ORDER — SODIUM CHLORIDE 0.9% FLUSH: 3.0000 mL | INTRAVENOUS | Status: DC | PRN

## 2024-07-27 MED ORDER — SODIUM CHLORIDE 0.9 % IV SOLN: 250.0000 mL | INTRAVENOUS | Status: AC | PRN

## 2024-07-27 MED ORDER — ACETAMINOPHEN 325 MG PO TABS
650.0000 mg | ORAL_TABLET | ORAL | Status: DC | PRN
  Administered 2024-07-28 – 2024-07-30 (×5): 650 mg via ORAL
  Filled 2024-07-27 (×5): qty 2

## 2024-07-27 MED ORDER — HYDRALAZINE HCL 25 MG PO TABS
25.0000 mg | ORAL_TABLET | Freq: Three times a day (TID) | ORAL | Status: DC
  Administered 2024-07-27: 25 mg via ORAL
  Filled 2024-07-27: qty 1

## 2024-07-27 MED ORDER — SODIUM CHLORIDE 0.9% FLUSH
3.0000 mL | Freq: Two times a day (BID) | INTRAVENOUS | Status: DC
  Administered 2024-07-27 – 2024-08-07 (×18): 3 mL via INTRAVENOUS

## 2024-07-27 MED ORDER — HEPARIN SODIUM (PORCINE) 5000 UNIT/ML IJ SOLN
5000.0000 [IU] | Freq: Three times a day (TID) | INTRAMUSCULAR | Status: DC
  Administered 2024-07-27 – 2024-08-07 (×27): 5000 [IU] via SUBCUTANEOUS
  Filled 2024-07-27 (×31): qty 1

## 2024-07-27 MED ORDER — FUROSEMIDE 10 MG/ML IJ SOLN
40.0000 mg | Freq: Once | INTRAMUSCULAR | Status: AC
  Administered 2024-07-27: 40 mg via INTRAVENOUS
  Filled 2024-07-27: qty 4

## 2024-07-27 MED ORDER — LABETALOL HCL 5 MG/ML IV SOLN
10.0000 mg | Freq: Once | INTRAVENOUS | Status: AC
  Administered 2024-07-27: 10 mg via INTRAVENOUS
  Filled 2024-07-27: qty 4

## 2024-07-27 MED ORDER — POTASSIUM CHLORIDE CRYS ER 20 MEQ PO TBCR
40.0000 meq | EXTENDED_RELEASE_TABLET | Freq: Once | ORAL | Status: AC
  Administered 2024-07-27: 40 meq via ORAL
  Filled 2024-07-27: qty 2

## 2024-07-27 MED ORDER — ISOSORBIDE DINITRATE 10 MG PO TABS
10.0000 mg | ORAL_TABLET | Freq: Three times a day (TID) | ORAL | Status: DC
  Administered 2024-07-27: 10 mg via ORAL
  Filled 2024-07-27: qty 1
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
## 2024-07-27 NOTE — ED Provider Notes (Signed)
 [place] [facility] Provider Note   CSN: [id number] Arrival date & time: 07/27/24  1619     Patient presents with: Shortness of Breath   Nathan Heath is a 38 y.o. male.   This is a 38 year old male presenting the emergency department with shortness of breath.  Reports URI symptoms 2 weeks ago with worsening shortness of breath, chest tightness.  Reports dyspnea on exertion as well as orthopnea for the past few days which was significantly worse today while he was trying to take a nap.  Continues to have a scratchy throat, but no fevers chills.  Low risk for PE based on Wells criteria.  No cardiac history.  Reports his only medical problem is hypertension for which she takes amlodipine .  Also notes remote history of bariatric surgery with significant weight loss.  Feels mildly short of breath currently.  Not having active chest pain   Shortness of Breath      Prior to Admission medications   Medication Sig Start Date End Date Taking? Authorizing Provider  amLODipine  (NORVASC ) 5 MG tablet Take 1 tablet (5 mg total) by mouth daily. 03/12/24   Adah Corning A, FNP  Azilsartan-Chlorthalidone (EDARBYCLOR) 40-12.5 MG TABS Take by mouth.  07/29/19  [provider]    Allergies: Patient has no known allergies.    Review of Systems  Respiratory:  Positive for shortness of breath.     Updated Vital Signs BP (!) 169/133   Pulse 97   Temp 98.3 F (36.8 C) (Oral)   Resp 20   SpO2 99%   Physical Exam Vitals and nursing note reviewed.  Constitutional:      General: He is not in acute distress.    Appearance: He is not toxic-appearing.  HENT:     Head: Normocephalic.  Cardiovascular:     Rate and Rhythm: Normal rate and regular rhythm.  Pulmonary:     Effort: Pulmonary effort is normal.     Breath sounds: Rales present.  Chest:     Chest wall: No tenderness.  Musculoskeletal:     Cervical back: Normal range of motion.     Right  lower leg: No edema.     Left lower leg: No edema.  Skin:    General: Skin is warm.     Capillary Refill: Capillary refill takes less than 2 seconds.  Neurological:     Mental Status: He is oriented to person, place, and time.  Psychiatric:        Mood and Affect: Mood normal.        Behavior: Behavior normal.     (all labs ordered are listed, but only abnormal results are displayed) Labs Reviewed  BASIC METABOLIC PANEL WITH GFR - Abnormal; Notable for the following components:      Result Value   Potassium 3.3 (*)    Chloride 96 (*)    Glucose, Bld 114 (*)    BUN 57 (*)    Creatinine, Ser 6.34 (*)    GFR, Estimated 11 (*)    Anion gap 16 (*)    All other components within normal limits  CBC - Abnormal; Notable for the following components:   RBC 3.38 (*)    Hemoglobin 9.8 (*)    HCT 29.0 (*)    Platelets 509 (*)    All other components within normal limits  PRO BRAIN NATRIURETIC PEPTIDE - Abnormal; Notable for the following components:   Pro Brain Natriuretic Peptide 27,428.0 (*)  All other components within normal limits  TROPONIN T, HIGH SENSITIVITY - Abnormal; Notable for the following components:   Troponin T High Sensitivity 138 (*)    All other components within normal limits  GROUP A STREP BY PCR  D-DIMER, QUANTITATIVE  LACTIC ACID, PLASMA  LACTIC ACID, PLASMA  URINE DRUG SCREEN  HEPATIC FUNCTION PANEL  TROPONIN T, HIGH SENSITIVITY    EKG: EKG Interpretation Date/Time:  Sunday July 27 2024 16:29:03 EDT Ventricular Rate:  118 PR Interval:  139 QRS Duration:  100 QT Interval:  351 QTC Calculation: 492 R Axis:   71  Text Interpretation: Sinus tachycardia Consider right atrial enlargement Left ventricular hypertrophy Borderline prolonged QT interval Confirmed by Neysa Clap [phone]) on 07/27/2024 4:40:57 PM  Radiology: [facility] Chest 2 View Result Date: 07/27/2024 CLINICAL DATA:  Shortness of breath. EXAM: CHEST - 2 VIEW COMPARISON:  November 13, 2019.  FINDINGS: Stable cardiomediastinal silhouette. Minimal perihilar and bibasilar interstitial densities are noted with possible Kerley B lines present, suggesting minimal pulmonary edema. Bony thorax is unremarkable. IMPRESSION: Possible minimal bilateral pulmonary edema. Electronically Signed   By: Lynwood Landy Raddle M.D.   On: 07/27/2024 16:59     .Critical Care  Performed by: Neysa Clap PARAS, DO Authorized by: Neysa Clap PARAS, DO   Critical care provider statement:    Critical care time (minutes):  30   Critical care was necessary to treat or prevent imminent or life-threatening deterioration of the following conditions:  Circulatory failure   Critical care was time spent personally by me on the following activities:  Development of treatment plan with patient or surrogate, discussions with consultants, evaluation of patient's response to treatment, examination of patient, ordering and review of laboratory studies, ordering and review of radiographic studies, ordering and performing treatments and interventions, pulse oximetry, re-evaluation of patient's condition and review of old charts    Medications Ordered in the ED  potassium chloride  SA (KLOR-CON  M) CR tablet 40 mEq (has no administration in time range)  furosemide  (LASIX ) injection 40 mg (has no administration in time range)  labetalol  (NORMODYNE ) injection 10 mg (10 mg Intravenous Given 07/27/24 1748)    Clinical Course as of 07/27/24 1827  Sun Jul 27, 2024  1730 DG Chest 2 View IMPRESSION: Possible minimal bilateral pulmonary edema.   Electronically Signed   By: Lynwood Landy Raddle M.D.   On: 07/27/2024 16:59   [TY]  1743 Creatinine(!): 6.34 ARF. Does appear to have baseline CKD with CR ~2 [TY]  1744 D-Dimer, Quant: 0.34 PE unlikely [TY]  1757 Troponin T High Sensitivity(!!): 138 Not having active chest pain. [TY]  1802 Pro Brain Natriuretic Peptide(!): 27,428.0 Consistent with CHF.  Bedside echo performed by me did show  cardiomyopathy with depressed subjective EF. [TY]  1802 Case discussed with Dr. Jerrell Orchard with cardiology who will accept patient to his service.  Recommending Lasix  and potassium.  Also requesting nephrology consult.  Will transfer to [facility].  [TY]    Clinical Course User Index [TY] Neysa Clap PARAS, DO                                 Medical Decision Making This is a 38 year old male with past medical history that includes hypertension, sleep apnea presenting with worsening symptoms of CHF with no history of the same.  He is tachycardic, hypertensive, but maintaining oxygen saturation on room air and does not appear to  be in respiratory distress.  Clinically appears euvolemic.  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  Broad workup underway.  Also given his tachycardia cannot exclude PE, will get D-dimer.  Patient given labetalol  for hypertension and tachycardia.  See ED course for further MDM disposition  Amount and/or Complexity of Data Reviewed External Data Reviewed:     Details: No prior echo.  Does appear that prior creatinine around 2 Labs: ordered. Decision-making details documented in ED Course. Radiology: ordered and independent interpretation performed. Decision-making details documented in ED Course.    Details: Appear to have some pulmonary edema my independent interpretation.  No obvious pneumothorax ECG/medicine tests:     Details: No arrhythmia.  No STEMI Discussion of management or test interpretation with external provider(s): Cardiology for admission.  Nephrology who will see patient once he gets to [facility]  Risk Prescription drug management. Decision regarding hospitalization.      Final diagnoses:  None    ED Discharge Orders     None          Neysa Caron PARAS, DO 07/27/24 1827

## 2024-07-27 NOTE — ED Triage Notes (Addendum)
 Pt reports difficulty breathing and swallowing x 2 weeks. SOB with exertion. Palpitations with walking Worse today Feels like he is choking. Denies cough. No swelling

## 2024-07-27 NOTE — ED Notes (Signed)
 RT assessed in triage. No SOB noted, stated it is worse when he lays down. BBS clear, good air movement in neck (stated he had sore throat) RT to monitor as needed

## 2024-07-27 NOTE — ED Notes (Signed)
 Called [facility] for Transport @18 :24.  Spoke with Zachary

## 2024-07-28 ENCOUNTER — Encounter (HOSPITAL_COMMUNITY): Payer: Self-pay

## 2024-07-28 ENCOUNTER — Inpatient Hospital Stay (HOSPITAL_COMMUNITY): Payer: Self-pay

## 2024-07-28 DIAGNOSIS — R7989 Other specified abnormal findings of blood chemistry: Secondary | ICD-10-CM

## 2024-07-28 DIAGNOSIS — I161 Hypertensive emergency: Secondary | ICD-10-CM

## 2024-07-28 DIAGNOSIS — R0609 Other forms of dyspnea: Secondary | ICD-10-CM

## 2024-07-28 DIAGNOSIS — N179 Acute kidney failure, unspecified: Secondary | ICD-10-CM

## 2024-07-28 DIAGNOSIS — I509 Heart failure, unspecified: Secondary | ICD-10-CM

## 2024-07-28 DIAGNOSIS — I5041 Acute combined systolic (congestive) and diastolic (congestive) heart failure: Secondary | ICD-10-CM

## 2024-07-28 LAB — BASIC METABOLIC PANEL WITH GFR
Anion gap: 14 (ref 5–15)
BUN: 57 mg/dL — ABNORMAL HIGH (ref 6–20)
CO2: 24 mmol/L (ref 22–32)
Calcium: 8.8 mg/dL — ABNORMAL LOW (ref 8.9–10.3)
Chloride: 97 mmol/L — ABNORMAL LOW (ref 98–111)
Creatinine, Ser: 6.51 mg/dL — ABNORMAL HIGH (ref 0.61–1.24)
GFR, Estimated: 10 mL/min — ABNORMAL LOW (ref >60)
Glucose, Bld: 102 mg/dL — ABNORMAL HIGH (ref 70–99)
Potassium: 2.8 mmol/L — ABNORMAL LOW (ref 3.5–5.1)
Sodium: 135 mmol/L (ref 135–145)

## 2024-07-28 LAB — ECHOCARDIOGRAM COMPLETE
Calc EF: 38.5 %
Height: 71 in
S' Lateral: 5.3 cm
Single Plane A2C EF: 40.1 %
Single Plane A4C EF: 35.7 %
Weight: 3615.54 [oz_av]

## 2024-07-28 LAB — IRON AND TIBC
Iron: 20 ug/dL — ABNORMAL LOW (ref 45–182)
Saturation Ratios: 7 % — ABNORMAL LOW (ref 17.9–39.5)
TIBC: 300 ug/dL (ref 250–450)
UIBC: 280 ug/dL

## 2024-07-28 LAB — TSH: TSH: 0.321 u[IU]/mL — ABNORMAL LOW (ref 0.350–4.500)

## 2024-07-28 LAB — PROTEIN / CREATININE RATIO, URINE
Creatinine, Urine: 78 mg/dL
Protein Creatinine Ratio: 1.15 mg/mg{creat} — ABNORMAL HIGH (ref 0.00–0.15)
Total Protein, Urine: 90 mg/dL

## 2024-07-28 LAB — CBC
HCT: 29.1 % — ABNORMAL LOW (ref 39.0–52.0)
Hemoglobin: 9.7 g/dL — ABNORMAL LOW (ref 13.0–17.0)
MCH: 29 pg (ref 26.0–34.0)
MCHC: 33.3 g/dL (ref 30.0–36.0)
MCV: 87.1 fL (ref 80.0–100.0)
Platelets: 459 K/uL — ABNORMAL HIGH (ref 150–400)
RBC: 3.34 MIL/uL — ABNORMAL LOW (ref 4.22–5.81)
RDW: 12.9 % (ref 11.5–15.5)
WBC: 7.8 K/uL (ref 4.0–10.5)
nRBC: 0 % (ref 0.0–0.2)

## 2024-07-28 LAB — T4, FREE: Free T4: 1.54 ng/dL — ABNORMAL HIGH (ref 0.61–1.12)

## 2024-07-28 LAB — LIPID PANEL
Cholesterol: 178 mg/dL (ref 0–200)
HDL: 25 mg/dL — ABNORMAL LOW (ref >40)
LDL Cholesterol: 130 mg/dL — ABNORMAL HIGH (ref 0–99)
Total CHOL/HDL Ratio: 7.1 ratio
Triglycerides: 115 mg/dL (ref <150)
VLDL: 23 mg/dL (ref 0–40)

## 2024-07-28 LAB — FERRITIN: Ferritin: 205 ng/mL (ref 24–336)

## 2024-07-28 LAB — HEMOGLOBIN A1C
Hgb A1c MFr Bld: 5.3 % (ref 4.8–5.6)
Mean Plasma Glucose: 105.41 mg/dL

## 2024-07-28 LAB — MAGNESIUM: Magnesium: 2.1 mg/dL (ref 1.7–2.4)

## 2024-07-28 LAB — HIV ANTIBODY (ROUTINE TESTING W REFLEX): HIV Screen 4th Generation wRfx: NONREACTIVE

## 2024-07-28 MED ORDER — FUROSEMIDE 10 MG/ML IJ SOLN
80.0000 mg | Freq: Two times a day (BID) | INTRAMUSCULAR | Status: DC
  Administered 2024-07-28 – 2024-07-30 (×5): 80 mg via INTRAVENOUS
  Filled 2024-07-28 (×5): qty 8

## 2024-07-28 MED ORDER — HYDRALAZINE HCL 50 MG PO TABS: 50.0000 mg | ORAL_TABLET | Freq: Three times a day (TID) | ORAL | Status: DC

## 2024-07-28 MED ORDER — HYDRALAZINE HCL 50 MG PO TABS
75.0000 mg | ORAL_TABLET | Freq: Three times a day (TID) | ORAL | Status: DC
  Administered 2024-07-28 (×3): 75 mg via ORAL
  Filled 2024-07-28 (×4): qty 1

## 2024-07-28 MED ORDER — PERFLUTREN LIPID MICROSPHERE
1.0000 mL | INTRAVENOUS | Status: AC | PRN
  Administered 2024-07-28: 2 mL via INTRAVENOUS

## 2024-07-28 MED ORDER — ISOSORBIDE DINITRATE 10 MG PO TABS
20.0000 mg | ORAL_TABLET | Freq: Three times a day (TID) | ORAL | Status: DC
  Administered 2024-07-28 – 2024-07-29 (×4): 20 mg via ORAL
  Filled 2024-07-28 (×4): qty 2

## 2024-07-28 MED ORDER — POTASSIUM CHLORIDE CRYS ER 20 MEQ PO TBCR
20.0000 meq | EXTENDED_RELEASE_TABLET | Freq: Two times a day (BID) | ORAL | Status: AC
  Administered 2024-07-28 (×2): 20 meq via ORAL
  Filled 2024-07-28 (×2): qty 1

## 2024-07-28 MED ORDER — AMLODIPINE BESYLATE 5 MG PO TABS
5.0000 mg | ORAL_TABLET | Freq: Every day | ORAL | Status: DC
  Administered 2024-07-28: 5 mg via ORAL
  Filled 2024-07-28: qty 1

## 2024-07-28 MED ORDER — HYDRALAZINE HCL 25 MG PO TABS
25.0000 mg | ORAL_TABLET | Freq: Once | ORAL | Status: AC
  Administered 2024-07-28: 25 mg via ORAL
  Filled 2024-07-28: qty 1

## 2024-07-28 MED ORDER — ISOSORBIDE DINITRATE 10 MG PO TABS
10.0000 mg | ORAL_TABLET | Freq: Once | ORAL | Status: AC
  Administered 2024-07-28: 10 mg via ORAL
  Filled 2024-07-28: qty 1

## 2024-07-28 NOTE — Progress Notes (Signed)
 Heart Failure Navigator Progress Note  Assessed for Heart & Vascular TOC clinic readiness.  Patient does not meet criteria due to Advanced Heart Failure Team was consulted. .   Navigator will sign off at this time.   Stephane Haddock, BSN, Scientist, clinical (histocompatibility and immunogenetics) Only

## 2024-07-28 NOTE — Progress Notes (Addendum)
   07/28/24 1655  Vitals  Temp 98.9 F (37.2 C)  Temp Source Oral  BP (!) 162/99  MAP (mmHg) 115  BP Location Right Arm  BP Method Automatic  Patient Position (if appropriate) Lying  Pulse Rate (!) 113  Pulse Rate Source Monitor  ECG Heart Rate (!) 113  Resp 16  Level of Consciousness  Level of Consciousness Alert  MEWS COLOR  MEWS Score Color Yellow  Oxygen Therapy  SpO2 98 %  O2 Device Room Air  Patient Activity (if Appropriate) In bed  Pain Assessment  Pain Scale 0-10  Pain Score 0  PCA/Epidural/Spinal Assessment  Respiratory Pattern Regular;Unlabored  Glasgow Coma Scale  Eye Opening 4  Best Verbal Response (NON-intubated) 5  Best Motor Response 6  Glasgow Coma Scale Score 15  MEWS Score  MEWS Temp 0  MEWS Systolic 0  MEWS Pulse 2  MEWS RR 0  MEWS LOC 0  MEWS Score 2  Provider Notification  Provider Name/Title Manuelita Dutch PA  Date Provider Notified 07/28/24  Time Provider Notified 1645  Method of Notification Face-to-face  Notification Reason Other (Comment) (MEWS)  Provider response At bedside;In department  Date of Provider Response 07/28/24  Time of Provider Response 1645   Patient is lying in bed, asymptomatic talking to family at bedside. Cards made aware of ST of 115-120s. Turner MD made RN aware that CHF team will round on patient soon. Manuelita from [facility] on department.

## 2024-07-28 NOTE — Progress Notes (Addendum)
 Progress Note  Patient Name: Nathan Heath Date of Encounter: 07/28/2024  [facility] Cardiologist: None   Patient Profile 38 year old obese male with hypertension and CKD admitted with shortness of breath and orthopnea with elevated BNP at 27,000, high-sensitivity troponin 138 and serum creatinine 6.3     Subjective   Started on Lasix  80 mg IV twice daily as well as hydralazine /Isordil  for acute CHF in the setting of hypertensive crisis, acute on chronic CKD.  He put out 1.8 L yesterday and is net -2.4 L since admission.  Serum creatinine 6.51 this morning and potassium 2.8  Feeling better this am with breathing and able to lay flat.  Has had intermittent chest tightness, more with exertion,  for the past several weeks.  Ran out of his amlodipine  a few weeks ago.  Inpatient Medications    Scheduled Meds:  furosemide   80 mg Intravenous BID   heparin   5,000 Units Subcutaneous Q8H   hydrALAZINE   75 mg Oral Q8H   isosorbide  dinitrate  20 mg Oral Q8H   potassium chloride   20 mEq Oral BID   sodium chloride  flush  3 mL Intravenous Q12H   Continuous Infusions:  sodium chloride      PRN Meds: sodium chloride , acetaminophen , sodium chloride  flush   Vital Signs    Vitals:   07/28/24 0248 07/28/24 0312 07/28/24 0607 07/28/24 0717  BP: (!) 159/119 (!) 181/145 (!) 191/144 (!) 165/112  Pulse:    (!) 107  Resp:    20  Temp:    97.8 F (36.6 C)  TempSrc:    Oral  SpO2:    99%  Weight:      Height:        Intake/Output Summary (Last 24 hours) at 07/28/2024 0859 Last data filed at 07/28/2024 0827 Gross per 24 hour  Intake --  Output 2400 ml  Net -2400 ml      07/28/2024    1:43 AM 07/27/2024    8:59 PM 04/19/2019    3:20 PM  Last 3 Weights  Weight (lbs) 225 lb 15.5 oz 225 lb 1.4 oz 265 lb  Weight (kg) 102.5 kg 102.1 kg 120.203 kg      Telemetry    Normal sinus rhythm- Personally Reviewed  ECG    No new EKG to review- Personally Reviewed  Physical Exam   GEN: No  acute distress.   Neck: No JVD Cardiac: RRR, no murmurs, rubs, or gallops.  Respiratory: Clear to auscultation bilaterally. GI: Soft, nontender, non-distended  MS: No edema; No deformity. Neuro:  Nonfocal  Psych: Normal affect   Labs    High Sensitivity Troponin:  No results for input(s): TROPONINIHS in the last 720 hours.    Chemistry Recent Labs  Lab 07/27/24 1632 07/27/24 1853 07/28/24 0349  NA 135  --  135  K 3.3*  --  2.8*  CL 96*  --  97*  CO2 24  --  24  GLUCOSE 114*  --  102*  BUN 57*  --  57*  CREATININE 6.34*  --  6.51*  CALCIUM 9.4  --  8.8*  PROT  --  7.3  --   ALBUMIN  --  3.9  --   AST  --  20  --   ALT  --  30  --   ALKPHOS  --  101  --   BILITOT  --  0.4  --   GFRNONAA 11*  --  10*  ANIONGAP 16*  --  14     Hematology Recent Labs  Lab 07/27/24 1632 07/28/24 0349  WBC 8.1 7.8  RBC 3.38* 3.34*  HGB 9.8* 9.7*  HCT 29.0* 29.1*  MCV 85.8 87.1  MCH 29.0 29.0  MCHC 33.8 33.3  RDW 12.9 12.9  PLT 509* 459*    BNP Recent Labs  Lab 07/27/24 1632  PROBNP 27,428.0*     DDimer  Recent Labs  Lab 07/27/24 1632  DDIMER 0.34     Radiology    DG Chest 2 View Result Date: 07/27/2024 CLINICAL DATA:  Shortness of breath. EXAM: CHEST - 2 VIEW COMPARISON:  November 13, 2019. FINDINGS: Stable cardiomediastinal silhouette. Minimal perihilar and bibasilar interstitial densities are noted with possible Kerley B lines present, suggesting minimal pulmonary edema. Bony thorax is unremarkable. IMPRESSION: Possible minimal bilateral pulmonary edema. Electronically Signed   By: Lynwood Landy Raddle M.D.   On: 07/27/2024 16:59    Patient Profile     38 y.o. male with a history of hypertension and CKD admitted with shortness of breath, orthopnea, AKI on CKD, acute CHF and worsening renal function.  This occurred in the setting of hypertensive urgency  Assessment & Plan    Acute CHF DOE  AKI on CKD Hypokalemia Elevated troponin - Chest x-ray showed minimal  bilateral pulmonary edema - BNP elevated at 27,428 - hs Trop 138>>140 >>has had some chest tightness at times for the past few weeks, more with exertion but suspect due to poorly controlled HTN>>not a candidate for Cath/coronary CTA at this time due to AKI - BP 196/157 mmHg on admission - 2D echo is pending but suspect that elevated troponin is related to demand ischemia in the setting of hypertensive emergency - Await results of echo - Good diuresis with Lasix  80 mg IV twice daily - Patient put out 1.8 L and is net -2.4 L since admission - SCr has increased from 6.34->>6.51>> suspect related to hypertensive emergency in the setting of known CKD (serum creatinine 2.22 on 06/18/2022) - Continue current dose of IV Lasix  for now - Will ask nephrology to see - Follow strict I's and O's, daily weights, renal function while diuresing - Nephrology to dose potassium going forward - Lipid panel this morning showed LDL 130, HDL 25 and triglycerides 115 - Ferritin normal - TSH 0.321>> check free T4 and T3 - Hemoglobin A1c 5.3% - Hydralazine  increased to 75 mg 3 times daily and Isordil  to 20 mg 3 times daily this morning  Hypertensive emergency - He has a history of hypertension in the past - BP on admission 196/157 mmHg - BP 165/112 mmHg this morning - Hydralazine  increased to 75 mg 3 times daily and Isordil  20 mg 3 times daily this morning - Hold off on beta-blocker for now given acute CHF exacerbation - If BP remains elevated will add on amlodipine  - BP should improve with diuresis - Check renal Dopplers to rule out renal artery stenosis - 24-hour urine for catecholamines, metanephrines, VMA, cortisol, dopamine - Check plasma renin, aldosterone and PRA  AKI on CKD - Will ask try to hospitalist to assume primary care with us  in consultation - He will need nephrology consult  I spent 40 minutes caring for this patient today face to face, ordering and reviewing labs, reviewing records from this  admission, seeing the patient, documenting in the record, and arranging for a 2D echo, renal duplex, 24-hour urine and labs    For questions or updates, please contact [facility] Please consult  [email or web address] for contact info under        Signed, Wilbert Bihari, MD  07/28/2024, 8:59 AM

## 2024-07-28 NOTE — Progress Notes (Signed)
 2D echo reviewed and showed: IMPRESSIONS     1. Prominent apical and lateral trabeculations with definity . Consider  f/u cardiac MRI to quantitate EF and r/o ventricular non compaction. .  Left ventricular ejection fraction, by estimation, is 35 to 40%. The left  ventricle has moderately decreased  function. The left ventricle demonstrates global hypokinesis. The left  ventricular internal cavity size was moderately dilated. There is mild  left ventricular hypertrophy. Left ventricular diastolic parameters were  normal.   2. Right ventricular systolic function is normal. The right ventricular  size is normal.   3. Left atrial size was severely dilated.   4. Right atrial size was moderately dilated.   5. The mitral valve is abnormal. Mild mitral valve regurgitation. No  evidence of mitral stenosis.   6. The aortic valve is tricuspid. There is mild calcification of the  aortic valve. There is mild thickening of the aortic valve. Aortic valve  regurgitation is not visualized. Aortic valve sclerosis is present, with  no evidence of aortic valve stenosis.   7. The inferior vena cava is normal in size with greater than 50%  respiratory variability, suggesting right atrial pressure of 3 mmHg.   Cannot determine how much of LV dysfunction is due to HTN CM or undx noncompaction.  Discussed possibility of cMRI with Dr. Kate but patient has AKI with SCr of 6 and would optimally want gad contrast with the MRI.  Will wait for now to see if renal function improves and consider outpt as it will not change immediate medical management.  Suspect that is AKI is from hypertensive emergency but could also have a component of cardiorenal so I have asked [facility] to consult on patient.

## 2024-07-28 NOTE — Progress Notes (Signed)
  Echocardiogram 2D Echocardiogram has been performed.  Tinnie FORBES Gosling RDCS 07/28/2024, 11:58 AM

## 2024-07-28 NOTE — Plan of Care (Signed)
   Problem: Health Behavior/Discharge Planning: Goal: Ability to manage health-related needs will improve Outcome: Progressing   Problem: Clinical Measurements: Goal: Will remain free from infection Outcome: Progressing   Problem: Activity: Goal: Risk for activity intolerance will decrease Outcome: Progressing
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
## 2024-07-28 NOTE — Progress Notes (Signed)
 24 hour urine collection starts 1000 Monday 07/28/24 and will end 1000 Tuesday 07/29/24. RN made nursing team aware of collection. Patient made aware.

## 2024-07-28 NOTE — Plan of Care (Signed)
   Problem: Education: Goal: Knowledge of General Education information will improve Description: Including pain rating scale, medication(s)/side effects and non-pharmacologic comfort measures Outcome: Progressing   Problem: Activity: Goal: Risk for activity intolerance will decrease Outcome: Progressing   Problem: Coping: Goal: Level of anxiety will decrease Outcome: Progressing

## 2024-07-29 ENCOUNTER — Inpatient Hospital Stay (HOSPITAL_COMMUNITY): Payer: MEDICAID

## 2024-07-29 DIAGNOSIS — I10 Essential (primary) hypertension: Secondary | ICD-10-CM

## 2024-07-29 LAB — BASIC METABOLIC PANEL WITH GFR
Anion gap: 18 — ABNORMAL HIGH (ref 5–15)
BUN: 50 mg/dL — ABNORMAL HIGH (ref 6–20)
CO2: 21 mmol/L — ABNORMAL LOW (ref 22–32)
Calcium: 9.3 mg/dL (ref 8.9–10.3)
Chloride: 96 mmol/L — ABNORMAL LOW (ref 98–111)
Creatinine, Ser: 6.24 mg/dL — ABNORMAL HIGH (ref 0.61–1.24)
GFR, Estimated: 11 mL/min — ABNORMAL LOW (ref >60)
Glucose, Bld: 110 mg/dL — ABNORMAL HIGH (ref 70–99)
Potassium: 2.7 mmol/L — CL (ref 3.5–5.1)
Sodium: 135 mmol/L (ref 135–145)

## 2024-07-29 LAB — HEPATITIS C ANTIBODY: HCV Ab: NONREACTIVE

## 2024-07-29 LAB — CBC
HCT: 29.9 % — ABNORMAL LOW (ref 39.0–52.0)
Hemoglobin: 10.1 g/dL — ABNORMAL LOW (ref 13.0–17.0)
MCH: 28.7 pg (ref 26.0–34.0)
MCHC: 33.8 g/dL (ref 30.0–36.0)
MCV: 84.9 fL (ref 80.0–100.0)
Platelets: 527 K/uL — ABNORMAL HIGH (ref 150–400)
RBC: 3.52 MIL/uL — ABNORMAL LOW (ref 4.22–5.81)
RDW: 13.1 % (ref 11.5–15.5)
WBC: 8.7 K/uL (ref 4.0–10.5)
nRBC: 0 % (ref 0.0–0.2)

## 2024-07-29 LAB — MAGNESIUM: Magnesium: 2 mg/dL (ref 1.7–2.4)

## 2024-07-29 LAB — HEPATITIS B SURFACE ANTIGEN: Hepatitis B Surface Ag: NONREACTIVE

## 2024-07-29 MED ORDER — AMLODIPINE BESYLATE 10 MG PO TABS: 10.0000 mg | ORAL_TABLET | Freq: Every day | ORAL | Status: DC

## 2024-07-29 MED ORDER — HYDRALAZINE HCL 20 MG/ML IJ SOLN
10.0000 mg | INTRAMUSCULAR | Status: DC | PRN
  Administered 2024-07-29: 10 mg via INTRAVENOUS
  Filled 2024-07-29: qty 1

## 2024-07-29 MED ORDER — AMLODIPINE BESYLATE 5 MG PO TABS
5.0000 mg | ORAL_TABLET | Freq: Every day | ORAL | Status: DC
  Administered 2024-07-29 – 2024-08-01 (×4): 5 mg via ORAL
  Filled 2024-07-29 (×4): qty 1

## 2024-07-29 MED ORDER — POTASSIUM CHLORIDE CRYS ER 20 MEQ PO TBCR
40.0000 meq | EXTENDED_RELEASE_TABLET | Freq: Two times a day (BID) | ORAL | Status: AC
  Administered 2024-07-29 (×2): 40 meq via ORAL
  Filled 2024-07-29 (×2): qty 2

## 2024-07-29 MED ORDER — BUTALBITAL-APAP-CAFFEINE 50-325-40 MG PO TABS
1.0000 | ORAL_TABLET | Freq: Once | ORAL | Status: AC
  Administered 2024-07-29: 1 via ORAL
  Filled 2024-07-29: qty 1

## 2024-07-29 MED ORDER — ISOSORBIDE DINITRATE 10 MG PO TABS
40.0000 mg | ORAL_TABLET | Freq: Three times a day (TID) | ORAL | Status: DC
  Administered 2024-07-29 – 2024-08-07 (×27): 40 mg via ORAL
  Filled 2024-07-29 (×29): qty 4

## 2024-07-29 MED ORDER — POTASSIUM CHLORIDE CRYS ER 20 MEQ PO TBCR
40.0000 meq | EXTENDED_RELEASE_TABLET | Freq: Once | ORAL | Status: AC
  Administered 2024-07-29: 40 meq via ORAL
  Filled 2024-07-29: qty 2

## 2024-07-29 MED ORDER — IRON SUCROSE 200 MG IVPB - SIMPLE MED: 200.0000 mg | Status: DC

## 2024-07-29 MED ORDER — HYDRALAZINE HCL 50 MG PO TABS
100.0000 mg | ORAL_TABLET | Freq: Three times a day (TID) | ORAL | Status: DC
  Administered 2024-07-29 – 2024-08-07 (×28): 100 mg via ORAL
  Filled 2024-07-29 (×30): qty 2

## 2024-07-29 NOTE — Progress Notes (Addendum)
 Nathan Heath PROGRESS NOTE  Assessment/ Plan: Pt is a 38 y.o. yo male  with a past medical history significant for hypertension, dyslipidemia, CKD stage IIIb who was presented with shortness of breath and orthopnea seen as a consultation for the evaluation of AKI on CKD.   # Acute kidney injury on CKD 3B versus progressive CKD5 in the setting of uncontrolled hypertension and CHF.  The creatinine level was 2.22 in 05/2022 and then no follow-up labs until this hospitalization with creatinine level 6.5 and hypertensive emergency.  This is most likely progressive CKD. UA with proteinuria, no RBC. UPCR around 1 g.  Hep B, hep C, HIV negative, ANA and protein electrophoresis pending.  Getting Doppler kidney ultrasound this morning. Will review for chronicity and to rule out obstruction, RAS. Agree with continuing IV diuretics for CHF management.  No urgent need for dialysis at this time.  Continue to avoid ACE inhibitor, ARBor SGLT2I.  I have discussed with the patient that he may need dialysis if no improvement in renal function. - Strict ins and outs and daily.   # Acute CHF exacerbation/orthopnea/acute mild pulmonary edema: Currently on IV diuretics, getting echo, managing by cardiologist.  May need cath.    # Hypertensive emergency: With fluid overload call.  Continue IV diuretics and added hydralazine , Isordil .  Added amlodipine  and if adjustment of current medication.  Monitor BP.  Avoid dropping blood pressure.   # Anemia chronic illness/CKD: Iron  saturation 7% with low hemoglobin.  Ordered IV iron .    # Hypokalemia: Continue to replete potassium chloride  and monitor lab.  Subjective: Seen and examined at bedside.  The urine output is around 4.3 L with diuretics.  Reports his breathing is much better.  Denies nausea, vomiting, dysgeusia, chest pain. Objective Vital signs in last 24 hours: Vitals:   07/28/24 1655 07/28/24 2251 07/29/24 0305 07/29/24 0723  BP: (!)  162/99 (!) 198/131 (!) 166/126   Pulse: (!) 113 (!) 116 (!) 115   Resp: 16 20 20    Temp: 98.9 F (37.2 C) 99.7 F (37.6 C) 99 F (37.2 C)   TempSrc: Oral Oral Oral   SpO2: 98% 100% 100%   Weight:    96.2 kg  Height:       Weight change:   Intake/Output Summary (Last 24 hours) at 07/29/2024 1031 Last data filed at 07/29/2024 0700 Gross per 24 hour  Intake 340 ml  Output 3175 ml  Net -2835 ml       Labs: RENAL PANEL Recent Labs  Lab 07/27/24 1632 07/27/24 1853 07/28/24 0349 07/29/24 0333  NA 135  --  135 135  K 3.3*  --  2.8* 2.7*  CL 96*  --  97* 96*  CO2 24  --  24 21*  GLUCOSE 114*  --  102* 110*  BUN 57*  --  57* 50*  CREATININE 6.34*  --  6.51* 6.24*  CALCIUM 9.4  --  8.8* 9.3  MG  --   --  2.1 2.0  ALBUMIN  --  3.9  --   --     Liver Function Tests: Recent Labs  Lab 07/27/24 1853  AST 20  ALT 30  ALKPHOS 101  BILITOT 0.4  PROT 7.3  ALBUMIN 3.9   No results for input(s): LIPASE, AMYLASE in the last 168 hours. No results for input(s): AMMONIA in the last 168 hours. CBC: Recent Labs    07/27/24 1632 07/28/24 0349 07/29/24 0333  HGB 9.8* 9.7* 10.1*  MCV 85.8 87.1 84.9  FERRITIN  --  205  --   TIBC  --  300  --   IRON   --  20*  --     Cardiac Enzymes: No results for input(s): CKTOTAL, CKMB, CKMBINDEX, TROPONINI in the last 168 hours. CBG: No results for input(s): GLUCAP in the last 168 hours.  Iron  Studies:  Recent Labs    07/28/24 0349  IRON  20*  TIBC 300  FERRITIN 205   Studies/Results: ECHOCARDIOGRAM COMPLETE Result Date: 07/28/2024    ECHOCARDIOGRAM REPORT   Patient Name:   Nathan Heath Date of Exam: 07/28/2024 Medical Rec #:  [id number]       Height:       71.0 in Accession #:    [id number]      Weight:       226.0 lb Date of Birth:  1986-04-10       BSA:          2.221 m Patient Age:    38 years        BP:           165/112 mmHg Patient Gender: M               HR:           112 bpm. Exam Location:  Inpatient  Procedure: 2D Echo, Intracardiac Opacification Agent, Color Doppler and Cardiac            Doppler (Both Spectral and Color Flow Doppler were utilized during            procedure). Indications:    Dyspnea R06.00  History:        Patient has no prior history of Echocardiogram examinations.  Sonographer:    [name] Referring Phys: [id number] VINCENT A DELGADO IMPRESSIONS  1. Prominent apical and lateral trabeculations with definity . Consider f/u cardiac MRI to quantitate EF and r/o ventricular non compaction. . Left ventricular ejection fraction, by estimation, is 35 to 40%. The left ventricle has moderately decreased function. The left ventricle demonstrates global hypokinesis. The left ventricular internal cavity size was moderately dilated. There is mild left ventricular hypertrophy. Left ventricular diastolic parameters were normal.  2. Right ventricular systolic function is normal. The right ventricular size is normal.  3. Left atrial size was severely dilated.  4. Right atrial size was moderately dilated.  5. The mitral valve is abnormal. Mild mitral valve regurgitation. No evidence of mitral stenosis.  6. The aortic valve is tricuspid. There is mild calcification of the aortic valve. There is mild thickening of the aortic valve. Aortic valve regurgitation is not visualized. Aortic valve sclerosis is present, with no evidence of aortic valve stenosis.  7. The inferior vena cava is normal in size with greater than 50% respiratory variability, suggesting right atrial pressure of 3 mmHg. FINDINGS  Left Ventricle: Prominent apical and lateral trabeculations with definity . Consider f/u cardiac MRI to quantitate EF and r/o ventricular non compaction. Left ventricular ejection fraction, by estimation, is 35 to 40%. The left ventricle has moderately decreased function. The left ventricle demonstrates global hypokinesis. Strain was performed and the global longitudinal strain is indeterminate. The left ventricular  internal cavity size was moderately dilated. There is mild left ventricular hypertrophy. Left ventricular diastolic parameters were normal. Right Ventricle: The right ventricular size is normal. No increase in right ventricular wall thickness. Right ventricular systolic function is normal. Left Atrium: Left atrial size was severely dilated. Right Atrium: Right atrial size was moderately  dilated. Pericardium: There is no evidence of pericardial effusion. Mitral Valve: The mitral valve is abnormal. There is mild thickening of the mitral valve leaflet(s). Mild mitral valve regurgitation. No evidence of mitral valve stenosis. Tricuspid Valve: The tricuspid valve is normal in structure. Tricuspid valve regurgitation is mild . No evidence of tricuspid stenosis. Aortic Valve: The aortic valve is tricuspid. There is mild calcification of the aortic valve. There is mild thickening of the aortic valve. Aortic valve regurgitation is not visualized. Aortic valve sclerosis is present, with no evidence of aortic valve stenosis. Pulmonic Valve: The pulmonic valve was normal in structure. Pulmonic valve regurgitation is trivial. No evidence of pulmonic stenosis. Aorta: The aortic root is normal in size and structure. Venous: The inferior vena cava is normal in size with greater than 50% respiratory variability, suggesting right atrial pressure of 3 mmHg. IAS/Shunts: No atrial level shunt detected by color flow Doppler. Additional Comments: 3D was performed not requiring image post processing on an independent workstation and was indeterminate.  LEFT VENTRICLE PLAX 2D LVIDd:         6.50 cm      Diastology LVIDs:         5.30 cm      LV e' medial:  14.40 cm/s LV PW:         1.30 cm      LV e' lateral: 7.62 cm/s LV IVS:        1.20 cm LVOT diam:     2.40 cm LV SV:         103 LV SV Index:   46 LVOT Area:     4.52 cm  LV Volumes (MOD) LV vol d, MOD A2C: 247.0 ml LV vol d, MOD A4C: 238.0 ml LV vol s, MOD A2C: 148.0 ml LV vol s, MOD  A4C: 153.0 ml LV SV MOD A2C:     99.0 ml LV SV MOD A4C:     238.0 ml LV SV MOD BP:      94.2 ml RIGHT VENTRICLE             IVC RV S prime:     13.90 cm/s  IVC diam: 1.40 cm TAPSE (M-mode): 2.3 cm LEFT ATRIUM           Index        RIGHT ATRIUM           Index LA diam:      5.40 cm 2.43 cm/m   RA Area:     22.60 cm LA Vol (A4C): 81.1 ml 36.52 ml/m  RA Volume:   63.90 ml  28.77 ml/m  AORTIC VALVE LVOT Vmax:   135.00 cm/s LVOT Vmean:  96.900 cm/s LVOT VTI:    0.228 m  AORTA Ao Root diam: 3.60 cm Ao Asc diam:  3.50 cm  SHUNTS Systemic VTI:  0.23 m Systemic Diam: 2.40 cm Maude Emmer MD Electronically signed by Maude Emmer MD Signature Date/Time: 07/28/2024/12:14:38 PM    Final    DG Chest 2 View Result Date: 07/27/2024 CLINICAL DATA:  Shortness of breath. EXAM: CHEST - 2 VIEW COMPARISON:  November 13, 2019. FINDINGS: Stable cardiomediastinal silhouette. Minimal perihilar and bibasilar interstitial densities are noted with possible Kerley B lines present, suggesting minimal pulmonary edema. Bony thorax is unremarkable. IMPRESSION: Possible minimal bilateral pulmonary edema. Electronically Signed   By: Lynwood Landy Raddle M.D.   On: 07/27/2024 16:59    Medications: Infusions:   Scheduled Medications:  amLODipine   5  mg Oral Daily   furosemide   80 mg Intravenous BID   heparin   5,000 Units Subcutaneous Q8H   hydrALAZINE   100 mg Oral Q8H   isosorbide  dinitrate  40 mg Oral Q8H   sodium chloride  flush  3 mL Intravenous Q12H    have reviewed scheduled and prn medications.  Physical Exam: General:NAD, comfortable Heart:RRR, s1s2 nl Lungs:clear b/l, no crackle Abdomen:soft, Non-tender, non-distended Extremities:No edema Neurology: Alert, awake and following commands  Baeleigh Devincent Prasad Jenessa Gillingham 07/29/2024,10:31 AM  LOS: 2 days

## 2024-07-29 NOTE — Plan of Care (Signed)
  Problem: Education: Goal: Knowledge of General Education information will improve Description: Including pain rating scale, medication(s)/side effects and non-pharmacologic comfort measures Outcome: Progressing   Problem: Health Behavior/Discharge Planning: Goal: Ability to manage health-related needs will improve Outcome: Progressing   Problem: Elimination: Goal: Will not experience complications related to bowel motility Outcome: Progressing Goal: Will not experience complications related to urinary retention Outcome: Progressing   Problem: Safety: Goal: Ability to remain free from injury will improve Outcome: Progressing   Problem: Skin Integrity: Goal: Risk for impaired skin integrity will decrease Outcome: Progressing   Problem: Clinical Measurements: Goal: Ability to maintain clinical measurements within normal limits will improve Outcome: Not Progressing Goal: Diagnostic test results will improve Outcome: Not Progressing Goal: Cardiovascular complication will be avoided Outcome: Not Progressing   Problem: Nutrition: Goal: Adequate nutrition will be maintained Outcome: Not Progressing   Problem: Coping: Goal: Level of anxiety will decrease Outcome: Not Progressing   Problem: Pain Managment: Goal: General experience of comfort will improve and/or be controlled Outcome: Not Progressing

## 2024-07-29 NOTE — TOC Initial Note (Addendum)
 Transition of Care [facility]) - Initial/Assessment Note    Patient Details  Name: Nathan Heath MRN: [id number] Date of Birth: 06-Dec-1985  [facility]) CM/SW Contact:    Justina Delcia Czar, RN Phone Number: [phone] 07/29/2024, 6:55 PM  Clinical Narrative:                 Spoke to pt and mother at bedside. Pt states he lives independently at home. Currently without insurance. Sent referral to financial counselor for Medicaid screening. Will cover medications with HF funds/MATCH. Pt PCP, Dr Benjamine. Will schedule hospital follow up appt at dc. Provided pt with scale for daily weights and pillbox. Provided pt with Living Better with HF. Educated provided on daily weights, medication adherence, and low sodium/heart healthy diet decreasing processed and sugary foods.      Expected Discharge Plan: Home/Self Care Barriers to Discharge: Continued Medical Work up   Patient Goals and CMS Choice Patient states their goals for this hospitalization and ongoing recovery are:: wants to remain independent          Expected Discharge Plan and Services       Living arrangements for the past 2 months: Single Family Home                                      Prior Living Arrangements/Services Living arrangements for the past 2 months: Single Family Home Lives with:: Self Patient language and need for interpreter reviewed:: Yes Do you feel safe going back to the place where you live?: Yes      Need for Family Participation in Patient Care: No (Comment)     Criminal Activity/Legal Involvement Pertinent to Current Situation/Hospitalization: No - Comment as needed  Activities of Daily Living   ADL Screening (condition at time of admission) Independently performs ADLs?: Yes (appropriate for developmental age) Is the patient deaf or have difficulty hearing?: No Does the patient have difficulty seeing, even when wearing glasses/contacts?: No Does the patient have  difficulty concentrating, remembering, or making decisions?: No  Permission Sought/Granted Permission sought to share information with : Case Manager, Family Supports, PCP Permission granted to share information with : Yes, Verbal Permission Granted  Share Information with NAME: Nathan Heath  Permission granted to share info w AGENCY: DME, PCP  Permission granted to share info w Relationship: mother  Permission granted to share info w Contact Information: [phone]  Emotional Assessment Appearance:: Appears stated age Attitude/Demeanor/Rapport: Engaged Affect (typically observed): Accepting Orientation: : Oriented to Self, Oriented to Place, Oriented to  Time, Oriented to Situation   Psych Involvement: No (comment)  Admission diagnosis:  Cardiomyopathy (HCC) [I42.9] 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] Patient Active Problem List   Diagnosis Date Noted   Acute congestive heart failure (HCC) 07/28/2024   DOE (dyspnea on exertion) 07/28/2024   Hypertensive emergency 07/28/2024   AKI (acute kidney injury) (HCC) 07/28/2024   Elevated troponin 07/28/2024   Cardiomyopathy (HCC) 07/27/2024   Acute systolic heart failure (HCC) 07/27/2024   PCP:  Benjamine Aland, MD Pharmacy:   [facility] [phone] - [place], [place] - [address] AT [facility] OF [address] [place] [address] Phone: [phone] Fax: [phone]  [id number] [address] [place] - [address] [place] [address] Phone: [phone] Fax: [phone]  [id number] - [place], [place] - [address] AT [facility] [address] [place] [address] Phone: [phone] Fax: [phone]  [facility] [address] [place] [address] Phone: [phone] Fax: [phone]     Social Drivers of Health (SDOH) Social History: SDOH Screenings    Food Insecurity: No Food Insecurity (07/28/2024)  Housing: Low Risk  (07/28/2024)  Transportation Needs: No Transportation Needs (07/28/2024)  Utilities: Not At Risk (07/28/2024)  Tobacco Use: Medium Risk (07/28/2024)   SDOH Interventions:     Readmission Risk Interventions     No data to display

## 2024-07-29 NOTE — Progress Notes (Signed)
 [facility] night cross cover note:   Prn iv hydralazine  added for elevated bp.      Eva Pore, DO Hospitalist

## 2024-07-29 NOTE — Progress Notes (Signed)
 Family updated again this morning. Patient, mom, brother, sister (over the phone) and aunt at bedside. All questions answered, family appreciative of update.   Beckey Coe, AGACNP-BC  Advanced Heart Failure Team  07/29/24

## 2024-07-29 NOTE — Progress Notes (Signed)
 Advanced Heart Failure Rounding Note   Subjective:    Out 3.6L with IV lasix   Scr 6.5 -> 6.2  K 2.7  SBPs still high 166-198   Breathing much better. No orthopnea or PND. Very frustrated  Objective:   Weight Range:  Vital Signs:   Temp:  [97.8 F (36.6 C)-99.7 F (37.6 C)] 99 F (37.2 C) (09/09 0305) Pulse Rate:  [107-116] 115 (09/09 0305) Resp:  [16-20] 20 (09/09 0305) BP: (162-198)/(99-134) 166/126 (09/09 0305) SpO2:  [98 %-100 %] 100 % (09/09 0305) Last BM Date : 07/28/24  Weight change: Filed Weights   07/27/24 2059 07/28/24 0143  Weight: 102.1 kg 102.5 kg    Intake/Output:   Intake/Output Summary (Last 24 hours) at 07/29/2024 0655 Last data filed at 07/28/2024 2100 Gross per 24 hour  Intake 400 ml  Output 3625 ml  Net -3225 ml     Physical Exam: General:  Well appearing. No resp difficulty HEENT: normal Neck: supple. JVP flat. Carotids 2+ bilat; no bruits. No lymphadenopathy or thryomegaly appreciated. Cor: PMI nondisplaced. Regular rate & rhythm. No rubs, gallops or murmurs. Lungs: clear Abdomen: soft, nontender, nondistended. No hepatosplenomegaly. No bruits or masses. Good bowel sounds. Extremities: no cyanosis, clubbing, rash, edema Neuro: alert & orientedx3, cranial nerves grossly intact. moves all 4 extremities w/o difficulty. Affect pleasant  Telemetry: ST 110-120 Personally reviewed   Labs: Basic Metabolic Panel: Recent Labs  Lab 07/27/24 1632 07/28/24 0349 07/29/24 0333  NA 135 135 135  K 3.3* 2.8* 2.7*  CL 96* 97* 96*  CO2 24 24 21*  GLUCOSE 114* 102* 110*  BUN 57* 57* 50*  CREATININE 6.34* 6.51* 6.24*  CALCIUM 9.4 8.8* 9.3  MG  --  2.1 2.0    Liver Function Tests: Recent Labs  Lab 07/27/24 1853  AST 20  ALT 30  ALKPHOS 101  BILITOT 0.4  PROT 7.3  ALBUMIN 3.9   No results for input(s): LIPASE, AMYLASE in the last 168 hours. No results for input(s): AMMONIA in the last 168 hours.  CBC: Recent Labs  Lab  07/27/24 1632 07/28/24 0349 07/29/24 0333  WBC 8.1 7.8 8.7  HGB 9.8* 9.7* 10.1*  HCT 29.0* 29.1* 29.9*  MCV 85.8 87.1 84.9  PLT 509* 459* 527*    Cardiac Enzymes: No results for input(s): CKTOTAL, CKMB, CKMBINDEX, TROPONINI in the last 168 hours.  BNP: BNP (last 3 results) No results for input(s): BNP in the last 8760 hours.  ProBNP (last 3 results) Recent Labs    07/27/24 1632  PROBNP 27,428.0*      Other results:  Imaging: ECHOCARDIOGRAM COMPLETE Result Date: 07/28/2024    ECHOCARDIOGRAM REPORT   Patient Name:   Nathan Heath Date of Exam: 07/28/2024 Medical Rec #:  [id number]       Height:       71.0 in Accession #:    [id number]      Weight:       226.0 lb Date of Birth:  08/17/1986       BSA:          2.221 m Patient Age:    38 years        BP:           165/112 mmHg Patient Gender: M               HR:           112 bpm. Exam Location:  Inpatient Procedure: 2D Echo, Intracardiac Opacification  Agent, Color Doppler and Cardiac            Doppler (Both Spectral and Color Flow Doppler were utilized during            procedure). Indications:    Dyspnea R06.00  History:        Patient has no prior history of Echocardiogram examinations.  Sonographer:    [name] Referring Phys: [id number] VINCENT A DELGADO IMPRESSIONS  1. Prominent apical and lateral trabeculations with definity . Consider f/u cardiac MRI to quantitate EF and r/o ventricular non compaction. . Left ventricular ejection fraction, by estimation, is 35 to 40%. The left ventricle has moderately decreased function. The left ventricle demonstrates global hypokinesis. The left ventricular internal cavity size was moderately dilated. There is mild left ventricular hypertrophy. Left ventricular diastolic parameters were normal.  2. Right ventricular systolic function is normal. The right ventricular size is normal.  3. Left atrial size was severely dilated.  4. Right atrial size was moderately dilated.  5. The mitral  valve is abnormal. Mild mitral valve regurgitation. No evidence of mitral stenosis.  6. The aortic valve is tricuspid. There is mild calcification of the aortic valve. There is mild thickening of the aortic valve. Aortic valve regurgitation is not visualized. Aortic valve sclerosis is present, with no evidence of aortic valve stenosis.  7. The inferior vena cava is normal in size with greater than 50% respiratory variability, suggesting right atrial pressure of 3 mmHg. FINDINGS  Left Ventricle: Prominent apical and lateral trabeculations with definity . Consider f/u cardiac MRI to quantitate EF and r/o ventricular non compaction. Left ventricular ejection fraction, by estimation, is 35 to 40%. The left ventricle has moderately decreased function. The left ventricle demonstrates global hypokinesis. Strain was performed and the global longitudinal strain is indeterminate. The left ventricular internal cavity size was moderately dilated. There is mild left ventricular hypertrophy. Left ventricular diastolic parameters were normal. Right Ventricle: The right ventricular size is normal. No increase in right ventricular wall thickness. Right ventricular systolic function is normal. Left Atrium: Left atrial size was severely dilated. Right Atrium: Right atrial size was moderately dilated. Pericardium: There is no evidence of pericardial effusion. Mitral Valve: The mitral valve is abnormal. There is mild thickening of the mitral valve leaflet(s). Mild mitral valve regurgitation. No evidence of mitral valve stenosis. Tricuspid Valve: The tricuspid valve is normal in structure. Tricuspid valve regurgitation is mild . No evidence of tricuspid stenosis. Aortic Valve: The aortic valve is tricuspid. There is mild calcification of the aortic valve. There is mild thickening of the aortic valve. Aortic valve regurgitation is not visualized. Aortic valve sclerosis is present, with no evidence of aortic valve stenosis. Pulmonic Valve:  The pulmonic valve was normal in structure. Pulmonic valve regurgitation is trivial. No evidence of pulmonic stenosis. Aorta: The aortic root is normal in size and structure. Venous: The inferior vena cava is normal in size with greater than 50% respiratory variability, suggesting right atrial pressure of 3 mmHg. IAS/Shunts: No atrial level shunt detected by color flow Doppler. Additional Comments: 3D was performed not requiring image post processing on an independent workstation and was indeterminate.  LEFT VENTRICLE PLAX 2D LVIDd:         6.50 cm      Diastology LVIDs:         5.30 cm      LV e' medial:  14.40 cm/s LV PW:         1.30 cm  LV e' lateral: 7.62 cm/s LV IVS:        1.20 cm LVOT diam:     2.40 cm LV SV:         103 LV SV Index:   46 LVOT Area:     4.52 cm  LV Volumes (MOD) LV vol d, MOD A2C: 247.0 ml LV vol d, MOD A4C: 238.0 ml LV vol s, MOD A2C: 148.0 ml LV vol s, MOD A4C: 153.0 ml LV SV MOD A2C:     99.0 ml LV SV MOD A4C:     238.0 ml LV SV MOD BP:      94.2 ml RIGHT VENTRICLE             IVC RV S prime:     13.90 cm/s  IVC diam: 1.40 cm TAPSE (M-mode): 2.3 cm LEFT ATRIUM           Index        RIGHT ATRIUM           Index LA diam:      5.40 cm 2.43 cm/m   RA Area:     22.60 cm LA Vol (A4C): 81.1 ml 36.52 ml/m  RA Volume:   63.90 ml  28.77 ml/m  AORTIC VALVE LVOT Vmax:   135.00 cm/s LVOT Vmean:  96.900 cm/s LVOT VTI:    0.228 m  AORTA Ao Root diam: 3.60 cm Ao Asc diam:  3.50 cm  SHUNTS Systemic VTI:  0.23 m Systemic Diam: 2.40 cm Maude Emmer MD Electronically signed by Maude Emmer MD Signature Date/Time: 07/28/2024/12:14:38 PM    Final    DG Chest 2 View Result Date: 07/27/2024 CLINICAL DATA:  Shortness of breath. EXAM: CHEST - 2 VIEW COMPARISON:  November 13, 2019. FINDINGS: Stable cardiomediastinal silhouette. Minimal perihilar and bibasilar interstitial densities are noted with possible Kerley B lines present, suggesting minimal pulmonary edema. Bony thorax is unremarkable. IMPRESSION:  Possible minimal bilateral pulmonary edema. Electronically Signed   By: Lynwood Landy Raddle M.D.   On: 07/27/2024 16:59     Medications:     Scheduled Medications:  amLODipine   5 mg Oral Daily   furosemide   80 mg Intravenous BID   heparin   5,000 Units Subcutaneous Q8H   hydrALAZINE   75 mg Oral Q8H   isosorbide  dinitrate  20 mg Oral Q8H   sodium chloride  flush  3 mL Intravenous Q12H    Infusions:   PRN Medications: acetaminophen , sodium chloride  flush   Assessment/Plan:   Acute HFrEF - Echo EF 35-40%, apical and lateral trabeculations with definity , RV okay Suspect HTN CM - His mother is undergoing workup for cardiac amyloidosis - NYHA IIIb.  - Volume still up Continue IV diuretics today. Reassess tomorrow.  - GDMT limited by CKD (Scr > 6) - BP still high increase hydral/ISDN. Goals SBP 140-160 for now - Need to aggressively manage sleep apnea - Check myeloma panel, serum free light chains and urine immunofixation given mother's suspected history of cardiac amyloidosis.  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  Untreated OSA likely playing a role -Renal artery duplex, aldosterone/renin ration and urine catecholamines/metanephrines pending -Currently on hydralazine  75 TID, isordil  20 TID and amlodipine  5 mg daily. BP still high increase hydral/ISDN. Goal SBP 140-160 for now. 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 -Scr low 2s in 2023, > 6 on admission -Suspect  2/2 HTN -Nephrology following and completing workup - Scr improving slowly   Iron  deficiency anemia - T sat 7% and ferritin 203 - Hold off on IV iron  while waiting to see if renal function recovers enough for cMRI this admission   Abnormal thyroid function study - Low TSH (0.321), check Free T3 and Free T4   OSA -Has been off CPAP for years after machine malfunctioned -Will need outpatient sleep  study  Hypokalemia - supp   Length of Stay: 2   Toribio Fuel MD 07/29/2024, 6:55 AM  Advanced Heart Failure Team Pager [phone] (M-F; 7a - 4p)  Please contact [facility] for night-coverage after hours (4p -7a ) and weekends on [email or web address]

## 2024-07-29 NOTE — Progress Notes (Signed)
 Nathan Heath was admitted to the hospital for heart failure exacerbation under cardiology service.  Noted hypertensive crisis and reduced LV systolic function, requiring diuresis. He has developed progressive renal failure, nephrology has been consulted.   Patient followed by heart failure team and nephrology [facility] will assume as primary team in am, for further medical management.

## 2024-07-30 DIAGNOSIS — I5021 Acute systolic (congestive) heart failure: Secondary | ICD-10-CM

## 2024-07-30 DIAGNOSIS — R7989 Other specified abnormal findings of blood chemistry: Secondary | ICD-10-CM

## 2024-07-30 DIAGNOSIS — I161 Hypertensive emergency: Secondary | ICD-10-CM

## 2024-07-30 DIAGNOSIS — N179 Acute kidney failure, unspecified: Secondary | ICD-10-CM

## 2024-07-30 DIAGNOSIS — I429 Cardiomyopathy, unspecified: Secondary | ICD-10-CM

## 2024-07-30 LAB — PROTEIN ELECTROPHORESIS, SERUM
A/G Ratio: 0.9 (ref 0.7–1.7)
Albumin ELP: 3.4 g/dL (ref 2.9–4.4)
Alpha-1-Globulin: 0.4 g/dL (ref 0.0–0.4)
Alpha-2-Globulin: 1 g/dL (ref 0.4–1.0)
Beta Globulin: 1.3 g/dL (ref 0.7–1.3)
Gamma Globulin: 1.3 g/dL (ref 0.4–1.8)
Globulin, Total: 4 g/dL — ABNORMAL HIGH (ref 2.2–3.9)
Total Protein ELP: 7.4 g/dL (ref 6.0–8.5)

## 2024-07-30 LAB — CBC
HCT: 29.2 % — ABNORMAL LOW (ref 39.0–52.0)
Hemoglobin: 9.7 g/dL — ABNORMAL LOW (ref 13.0–17.0)
MCH: 28.4 pg (ref 26.0–34.0)
MCHC: 33.2 g/dL (ref 30.0–36.0)
MCV: 85.6 fL (ref 80.0–100.0)
Platelets: 524 K/uL — ABNORMAL HIGH (ref 150–400)
RBC: 3.41 MIL/uL — ABNORMAL LOW (ref 4.22–5.81)
RDW: 13.1 % (ref 11.5–15.5)
WBC: 11 K/uL — ABNORMAL HIGH (ref 4.0–10.5)
nRBC: 0 % (ref 0.0–0.2)

## 2024-07-30 LAB — T3, FREE: T3, Free: 2.9 pg/mL (ref 2.0–4.4)

## 2024-07-30 LAB — ANA W/REFLEX IF POSITIVE: Anti Nuclear Antibody (ANA): NEGATIVE

## 2024-07-30 LAB — BASIC METABOLIC PANEL WITH GFR
Anion gap: 15 (ref 5–15)
BUN: 49 mg/dL — ABNORMAL HIGH (ref 6–20)
CO2: 22 mmol/L (ref 22–32)
Calcium: 9.3 mg/dL (ref 8.9–10.3)
Chloride: 97 mmol/L — ABNORMAL LOW (ref 98–111)
Creatinine, Ser: 6.71 mg/dL — ABNORMAL HIGH (ref 0.61–1.24)
GFR, Estimated: 10 mL/min — ABNORMAL LOW (ref >60)
Glucose, Bld: 127 mg/dL — ABNORMAL HIGH (ref 70–99)
Potassium: 3.1 mmol/L — ABNORMAL LOW (ref 3.5–5.1)
Sodium: 134 mmol/L — ABNORMAL LOW (ref 135–145)

## 2024-07-30 LAB — KAPPA/LAMBDA LIGHT CHAINS
Kappa free light chain: 127.5 mg/L — ABNORMAL HIGH (ref 3.3–19.4)
Kappa, lambda light chain ratio: 1.68 — ABNORMAL HIGH (ref 0.26–1.65)
Lambda free light chains: 75.9 mg/L — ABNORMAL HIGH (ref 5.7–26.3)

## 2024-07-30 LAB — MAGNESIUM: Magnesium: 2.1 mg/dL (ref 1.7–2.4)

## 2024-07-30 MED ORDER — POTASSIUM CHLORIDE CRYS ER 20 MEQ PO TBCR
40.0000 meq | EXTENDED_RELEASE_TABLET | Freq: Once | ORAL | Status: AC
  Administered 2024-07-30: 40 meq via ORAL
  Filled 2024-07-30: qty 2

## 2024-07-30 MED ORDER — CARVEDILOL 3.125 MG PO TABS
3.1250 mg | ORAL_TABLET | Freq: Two times a day (BID) | ORAL | Status: DC
  Administered 2024-07-30 – 2024-08-01 (×5): 3.125 mg via ORAL
  Filled 2024-07-30 (×5): qty 1

## 2024-07-30 MED ORDER — OXYCODONE HCL 5 MG PO TABS: 5.0000 mg | ORAL_TABLET | ORAL | Status: DC | PRN

## 2024-07-30 MED ORDER — SODIUM CHLORIDE 0.9 % IV SOLN
500.0000 mg | Freq: Once | INTRAVENOUS | Status: AC
Start: 2024-07-30 — End: 2024-07-31
  Administered 2024-07-30: 500 mg via INTRAVENOUS
  Filled 2024-07-30: qty 25

## 2024-07-30 MED ORDER — FUROSEMIDE 40 MG PO TABS
40.0000 mg | ORAL_TABLET | Freq: Every day | ORAL | Status: DC
  Administered 2024-07-30: 40 mg via ORAL
  Filled 2024-07-30: qty 1

## 2024-07-30 MED ORDER — IRON SUCROSE 500 MG IVPB - SIMPLE MED
500.0000 mg | Freq: Once | INTRAVENOUS | Status: DC
Start: 2024-07-30 — End: 2024-07-30
  Filled 2024-07-30: qty 275

## 2024-07-30 NOTE — Progress Notes (Signed)
 Progress Note   [name] FMW:[id number] DOB: Jun 03, 1986 DOA: 07/27/2024     3 DOS: the patient was seen and examined on 07/30/2024   Brief hospital course: Nathan Heath is a 38 yr old African American male with history of hypertension, CKD presented for shortness of breath and orthopnea, PND admitted to heart failure service for new onset systolic CHF started on IV diuretics with good urine output. His symptoms did improve. Nephrology consulted for renal failure and need for HD. TRH service asked to take over his care.  Assessment and Plan: Acute systolic CHF- Presented with shortness of breath, orthopnea, PND, BNP of 27,000, troponin 138. Echocardiogram reviewed shows EF 35 to 40%. Unable to perform heart cath due to his renal dysfunction. He may need cardiac amyloidosis workup, as mother is being worked up for suspicion of cardiac amyloidosis. Continue order Lasix  40 mg daily. Continue to monitor daily weights, strict input and output. Monitor daily electrolytes, renal function. CHF instructions provided, fluid restriction advised.  IV diuretics switched to oral Lasix  therapy. Heart failure team follow-up appreciated.  Acute worsening of CKD stage 3b- Seen by nephrology. Continue to monitor urine output, daily renal function. Avoid nephrotoxic drugs including ACE inhibitors, ARB, SGLT2. Patient understands that he may need dialysis if kidney function does not improve.  Uncontrolled hypertension - Blood pressure improved.  Continue hydralazine , Isordil , Norvasc  and Coreg  IV hydralazine  as needed for elevated blood pressures ordered.  Hypokalemia- Will replete as needed. Caution as he has renal failure.  Hyponatremia- Due to hypervolemia Trend sodium.      Out of bed to chair. Incentive spirometry. Nursing supportive care. Fall, aspiration precautions. Diet:  Diet Orders (From admission, onward)     Start     Ordered   07/29/24 1130  Diet Heart Room  service appropriate? Yes; Fluid consistency: Thin; Fluid restriction: 2000 mL Fluid  Diet effective now       Question Answer Comment  Room service appropriate? Yes   Fluid consistency: Thin   Fluid restriction: 2000 mL Fluid      07/29/24 1129           DVT prophylaxis: heparin  injection 5,000 Units Start: 07/27/24 2345  Level of care: Telemetry Cardiac   Code Status: Full Code  Subjective: Patient is seen and examined today morning.  He is walking in the hallway with family.  Has shortness of breath with exertion but much improved than presentation.  States he does not wish to be on dialysis but understands importance of dialysis if needed.  Physical Exam: Vitals:   07/30/24 0744 07/30/24 0751 07/30/24 1102 07/30/24 1134  BP: (!) 164/107 (!) 164/99 (!) 166/109 (!) 174/117  Pulse: (!) 118 100 (!) 122 (!) 112  Resp:  18 18 18   Temp: 99.1 F (37.3 C) 98.6 F (37 C) 99 F (37.2 C) 99.2 F (37.3 C)  TempSrc: Oral Oral Oral Oral  SpO2: 97%   97%  Weight:      Height:        General - Young African-American male, mild respiratory distress HEENT - PERRLA, EOMI, atraumatic head, non tender sinuses. Lung - Clear, basal rales, no rhonchi, wheezes. Heart - S1, S2 heard, no murmurs, rubs, trace pedal edema. Abdomen - Soft, non tender, bowel sounds good Neuro - Alert, awake and oriented x 3, non focal exam. Skin - Warm and dry.  Data Reviewed:      Latest Ref Rng & Units 07/30/2024    2:50 AM 07/29/2024  3:33 AM 07/28/2024    3:49 AM  CBC  WBC 4.0 - 10.5 K/uL 11.0  8.7  7.8   Hemoglobin 13.0 - 17.0 g/dL 9.7  89.8  9.7   Hematocrit 39.0 - 52.0 % 29.2  29.9  29.1   Platelets 150 - 400 K/uL 524  527  459       Latest Ref Rng & Units 07/30/2024    2:50 AM 07/29/2024    3:33 AM 07/28/2024    3:49 AM  BMP  Glucose 70 - 99 mg/dL 872  889  897   BUN 6 - 20 mg/dL 49  50  57   Creatinine 0.61 - 1.24 mg/dL 3.28  3.75  3.48   Sodium 135 - 145 mmol/L 134  135  135   Potassium 3.5  - 5.1 mmol/L 3.1  2.7  C 2.8   Chloride 98 - 111 mmol/L 97  96  97   CO2 22 - 32 mmol/L 22  21  24    Calcium 8.9 - 10.3 mg/dL 9.3  9.3  8.8     C Corrected result   VAS US  RENAL ARTERY DUPLEX Result Date: 07/30/2024 ABDOMINAL VISCERAL Patient Name:  Nathan Heath  Date of Exam:   07/29/2024 Medical Rec #: [id number]        Accession #:    [id number] Date of Birth: May 05, 1986        Patient Gender: M Patient Age:   79 years Exam Location:  [facility] Procedure:      VAS US  RENAL ARTERY DUPLEX Referring Phys: [id number] TRACI R TURNER -------------------------------------------------------------------------------- High Risk Factors: Hypertension, past history of smoking. Other Factors: CKD. Limitations: Obesity, air/bowel gas, patient discomfort and Labored breathing. Performing Technologist: Edilia Elden Appl  Examination Guidelines: A complete evaluation includes B-mode imaging, spectral Doppler, color Doppler, and power Doppler as needed of all accessible portions of each vessel. Bilateral testing is considered an integral part of a complete examination. Limited examinations for reoccurring indications may be performed as noted.  Duplex Findings: +----------------------+--------+--------+------+--------+ Mesenteric            PSV cm/sEDV cm/sPlaqueComments +----------------------+--------+--------+------+--------+ Aorta Mid               142      19                  +----------------------+--------+--------+------+--------+ Celiac Artery Proximal  111      25                  +----------------------+--------+--------+------+--------+ SMA Proximal            199      26                  +----------------------+--------+--------+------+--------+    +------------------+--------+--------+-------+ Right Renal ArteryPSV cm/sEDV cm/sComment +------------------+--------+--------+-------+ Origin               95      18           +------------------+--------+--------+-------+  Proximal            138      19           +------------------+--------+--------+-------+ Mid                 160      20           +------------------+--------+--------+-------+ Distal  17      5            +------------------+--------+--------+-------+ +-----------------+--------+--------+-------+ Left Renal ArteryPSV cm/sEDV cm/sComment +-----------------+--------+--------+-------+ Origin                             N/V   +-----------------+--------+--------+-------+ Proximal            99      12           +-----------------+--------+--------+-------+ Mid                140      19           +-----------------+--------+--------+-------+ Distal              36      10           +-----------------+--------+--------+-------+ +------------+--------+--------+----+-----------+--------+--------+----+ Right KidneyPSV cm/sEDV cm/sRI  Left KidneyPSV cm/sEDV cm/sRI   +------------+--------+--------+----+-----------+--------+--------+----+ Upper Pole  11      5       0.53Upper Pole 13      5       0.59 +------------+--------+--------+----+-----------+--------+--------+----+ Mid         11      8       0.        10      7       0.32 +------------+--------+--------+----+-----------+--------+--------+----+ Lower Pole  23      10      0.57Lower Pole 16      8       0.51 +------------+--------+--------+----+-----------+--------+--------+----+ Hilar       13      8       0.37Hilar      17      8       0.56 +------------+--------+--------+----+-----------+--------+--------+----+ +------------------+-----+------------------+----+ Right Kidney           Left Kidney            +------------------+-----+------------------+----+ RAR                    RAR                    +------------------+-----+------------------+----+ RAR (manual)      1.1  RAR (manual)      1.0  +------------------+-----+------------------+----+  Cortex                 Cortex                 +------------------+-----+------------------+----+ Cortex thickness       Corex thickness        +------------------+-----+------------------+----+ Kidney length (cm)10.27Kidney length (cm)9.78 +------------------+-----+------------------+----+  Summary: Renal:  Right: Normal size right kidney. Normal right Resistive Index. 1-59%        stenosis of the right renal artery. RRV flow present. Left:  Normal size of left kidney. Normal left Resistive Index. No        evidence of left renal artery stenosis. LRV flow present. Mesenteric: Normal Celiac artery and Superior Mesenteric artery findings.  *See table(s) above for measurements and observations.  Diagnosing physician: Lonni Gaskins MD  Electronically signed by Lonni Gaskins MD on 07/30/2024 at 8:04:48 AM.    Final    ECHOCARDIOGRAM COMPLETE Result Date: 07/28/2024    ECHOCARDIOGRAM REPORT   Patient Name:   Nathan Heath Date of Exam: 07/28/2024 Medical Rec #:  [id number]       Height:  71.0 in Accession #:    [id number]      Weight:       226.0 lb Date of Birth:  Nov 23, 1985       BSA:          2.221 m Patient Age:    38 years        BP:           165/112 mmHg Patient Gender: M               HR:           112 bpm. Exam Location:  Inpatient Procedure: 2D Echo, Intracardiac Opacification Agent, Color Doppler and Cardiac            Doppler (Both Spectral and Color Flow Doppler were utilized during            procedure). Indications:    Dyspnea R06.00  History:        Patient has no prior history of Echocardiogram examinations.  Sonographer:    [name] Referring Phys: [id number] VINCENT A DELGADO IMPRESSIONS  1. Prominent apical and lateral trabeculations with definity . Consider f/u cardiac MRI to quantitate EF and r/o ventricular non compaction. . Left ventricular ejection fraction, by estimation, is 35 to 40%. The left ventricle has moderately decreased function. The left ventricle  demonstrates global hypokinesis. The left ventricular internal cavity size was moderately dilated. There is mild left ventricular hypertrophy. Left ventricular diastolic parameters were normal.  2. Right ventricular systolic function is normal. The right ventricular size is normal.  3. Left atrial size was severely dilated.  4. Right atrial size was moderately dilated.  5. The mitral valve is abnormal. Mild mitral valve regurgitation. No evidence of mitral stenosis.  6. The aortic valve is tricuspid. There is mild calcification of the aortic valve. There is mild thickening of the aortic valve. Aortic valve regurgitation is not visualized. Aortic valve sclerosis is present, with no evidence of aortic valve stenosis.  7. The inferior vena cava is normal in size with greater than 50% respiratory variability, suggesting right atrial pressure of 3 mmHg. FINDINGS  Left Ventricle: Prominent apical and lateral trabeculations with definity . Consider f/u cardiac MRI to quantitate EF and r/o ventricular non compaction. Left ventricular ejection fraction, by estimation, is 35 to 40%. The left ventricle has moderately decreased function. The left ventricle demonstrates global hypokinesis. Strain was performed and the global longitudinal strain is indeterminate. The left ventricular internal cavity size was moderately dilated. There is mild left ventricular hypertrophy. Left ventricular diastolic parameters were normal. Right Ventricle: The right ventricular size is normal. No increase in right ventricular wall thickness. Right ventricular systolic function is normal. Left Atrium: Left atrial size was severely dilated. Right Atrium: Right atrial size was moderately dilated. Pericardium: There is no evidence of pericardial effusion. Mitral Valve: The mitral valve is abnormal. There is mild thickening of the mitral valve leaflet(s). Mild mitral valve regurgitation. No evidence of mitral valve stenosis. Tricuspid Valve: The  tricuspid valve is normal in structure. Tricuspid valve regurgitation is mild . No evidence of tricuspid stenosis. Aortic Valve: The aortic valve is tricuspid. There is mild calcification of the aortic valve. There is mild thickening of the aortic valve. Aortic valve regurgitation is not visualized. Aortic valve sclerosis is present, with no evidence of aortic valve stenosis. Pulmonic Valve: The pulmonic valve was normal in structure. Pulmonic valve regurgitation is trivial. No evidence of pulmonic stenosis. Aorta: The aortic root is normal in size  and structure. Venous: The inferior vena cava is normal in size with greater than 50% respiratory variability, suggesting right atrial pressure of 3 mmHg. IAS/Shunts: No atrial level shunt detected by color flow Doppler. Additional Comments: 3D was performed not requiring image post processing on an independent workstation and was indeterminate.  LEFT VENTRICLE PLAX 2D LVIDd:         6.50 cm      Diastology LVIDs:         5.30 cm      LV e' medial:  14.40 cm/s LV PW:         1.30 cm      LV e' lateral: 7.62 cm/s LV IVS:        1.20 cm LVOT diam:     2.40 cm LV SV:         103 LV SV Index:   46 LVOT Area:     4.52 cm  LV Volumes (MOD) LV vol d, MOD A2C: 247.0 ml LV vol d, MOD A4C: 238.0 ml LV vol s, MOD A2C: 148.0 ml LV vol s, MOD A4C: 153.0 ml LV SV MOD A2C:     99.0 ml LV SV MOD A4C:     238.0 ml LV SV MOD BP:      94.2 ml RIGHT VENTRICLE             IVC RV S prime:     13.90 cm/s  IVC diam: 1.40 cm TAPSE (M-mode): 2.3 cm LEFT ATRIUM           Index        RIGHT ATRIUM           Index LA diam:      5.40 cm 2.43 cm/m   RA Area:     22.60 cm LA Vol (A4C): 81.1 ml 36.52 ml/m  RA Volume:   63.90 ml  28.77 ml/m  AORTIC VALVE LVOT Vmax:   135.00 cm/s LVOT Vmean:  96.900 cm/s LVOT VTI:    0.228 m  AORTA Ao Root diam: 3.60 cm Ao Asc diam:  3.50 cm  SHUNTS Systemic VTI:  0.23 m Systemic Diam: 2.40 cm Maude Emmer MD Electronically signed by Maude Emmer MD Signature  Date/Time: 07/28/2024/12:14:38 PM    Final     Family Communication: Discussed with patient, family at bedside.  They understand and agree. All questions answered.  Disposition: Status is: Inpatient Remains inpatient appropriate because: Severe dizziness  Planned Discharge Destination: Home     Time spent: 53 minutes  Author: Concepcion Riser, MD 07/30/2024 11:46 AM Secure chat 7am to 7pm For on call review [email or web address].

## 2024-07-30 NOTE — Progress Notes (Signed)
 Mobility Specialist Progress Note:    07/30/24 1000  Mobility  Activity Ambulated independently  Level of Assistance Independent after set-up  Assistive Device None  Distance Ambulated (ft) 500 ft  Activity Response Tolerated well  Mobility Referral Yes  Mobility visit 1 Mobility  Mobility Specialist Start Time (ACUTE ONLY) 1000  Mobility Specialist Stop Time (ACUTE ONLY) 1015  Mobility Specialist Time Calculation (min) (ACUTE ONLY) 15 min   Pt pleasant and agreeable to session. No c/o any symptoms but pt has slight limp when walking. Might be better to use RW but doesn't want to. Returned pt to room w/ all needs met.   Venetia Keel Mobility Specialist Please Neurosurgeon or Rehab Office at [phone]

## 2024-07-30 NOTE — Plan of Care (Signed)
   Problem: Activity: Goal: Risk for activity intolerance will decrease Outcome: Progressing   Problem: Nutrition: Goal: Adequate nutrition will be maintained Outcome: Progressing   Problem: Coping: Goal: Level of anxiety will decrease Outcome: Progressing   Problem: Elimination: Goal: Will not experience complications related to bowel motility Outcome: Progressing

## 2024-07-30 NOTE — Progress Notes (Addendum)
 Advanced Heart Failure Rounding Note  HF Consulting Physician: Dr. Cherrie  Subjective:    Dyspnea and orthopnea significantly improved. Notes headaches with high blood pressures.   Frustrated by his current health status.  Objective:   Weight Range:  Vital Signs:   Temp:  [98.6 F (37 C)-99.2 F (37.3 C)] 98.6 F (37 C) (09/10 0751) Pulse Rate:  [100-122] 100 (09/10 0751) Resp:  [18-22] 18 (09/10 0751) BP: (150-169)/(87-121) 164/99 (09/10 0751) SpO2:  [97 %-100 %] 97 % (09/10 0744) Weight:  [96.9 kg] 96.9 kg (09/10 0542) Last BM Date : 07/28/24  Weight change: Filed Weights   07/28/24 0143 07/29/24 0723 07/30/24 0542  Weight: 102.5 kg 96.2 kg 96.9 kg    Intake/Output:  No intake or output data in the 24 hours ending 07/30/24 0920    Physical Exam: General:  Well appearing.  Cor: No JVD. Regular rate & rhythm, tachy. No murmurs. Lungs: clear Abdomen: soft, nontender, nondistended.  Extremities: no edema Neuro: alert & orientedx3. Affect pleasant  Telemetry: ST 100s   Labs: Basic Metabolic Panel: Recent Labs  Lab 07/27/24 1632 07/28/24 0349 07/29/24 0333 07/30/24 0250  NA 135 135 135 134*  K 3.3* 2.8* 2.7* 3.1*  CL 96* 97* 96* 97*  CO2 24 24 21* 22  GLUCOSE 114* 102* 110* 127*  BUN 57* 57* 50* 49*  CREATININE 6.34* 6.51* 6.24* 6.71*  CALCIUM 9.4 8.8* 9.3 9.3  MG  --  2.1 2.0 2.1    Liver Function Tests: Recent Labs  Lab 07/27/24 1853  AST 20  ALT 30  ALKPHOS 101  BILITOT 0.4  PROT 7.3  ALBUMIN 3.9   No results for input(s): LIPASE, AMYLASE in the last 168 hours. No results for input(s): AMMONIA in the last 168 hours.  CBC: Recent Labs  Lab 07/27/24 1632 07/28/24 0349 07/29/24 0333 07/30/24 0250  WBC 8.1 7.8 8.7 11.0*  HGB 9.8* 9.7* 10.1* 9.7*  HCT 29.0* 29.1* 29.9* 29.2*  MCV 85.8 87.1 84.9 85.6  PLT 509* 459* 527* 524*    Cardiac Enzymes: No results for input(s): CKTOTAL, CKMB, CKMBINDEX, TROPONINI in  the last 168 hours.  BNP: BNP (last 3 results) No results for input(s): BNP in the last 8760 hours.  ProBNP (last 3 results) Recent Labs    07/27/24 1632  PROBNP 27,428.0*      Other results:  Imaging: VAS US  RENAL ARTERY DUPLEX Result Date: 07/30/2024 ABDOMINAL VISCERAL Patient Name:  Nathan Heath  Date of Exam:   07/29/2024 Medical Rec #: [id number]        Accession #:    [id number] Date of Birth: 30-Jul-1986        Patient Gender: M Patient Age:   38 years Exam Location:  [facility] Procedure:      VAS US  RENAL ARTERY DUPLEX Referring Phys: [id number] TRACI R TURNER -------------------------------------------------------------------------------- High Risk Factors: Hypertension, past history of smoking. Other Factors: CKD. Limitations: Obesity, air/bowel gas, patient discomfort and Labored breathing. Performing Technologist: Edilia Elden Appl  Examination Guidelines: A complete evaluation includes B-mode imaging, spectral Doppler, color Doppler, and power Doppler as needed of all accessible portions of each vessel. Bilateral testing is considered an integral part of a complete examination. Limited examinations for reoccurring indications may be performed as noted.  Duplex Findings: +----------------------+--------+--------+------+--------+ Mesenteric            PSV cm/sEDV cm/sPlaqueComments +----------------------+--------+--------+------+--------+ Aorta Mid  142      19                  +----------------------+--------+--------+------+--------+ Celiac Artery Proximal  111      25                  +----------------------+--------+--------+------+--------+ SMA Proximal            199      26                  +----------------------+--------+--------+------+--------+    +------------------+--------+--------+-------+ Right Renal ArteryPSV cm/sEDV cm/sComment +------------------+--------+--------+-------+ Origin               95      18            +------------------+--------+--------+-------+ Proximal            138      19           +------------------+--------+--------+-------+ Mid                 160      20           +------------------+--------+--------+-------+ Distal               17      5            +------------------+--------+--------+-------+ +-----------------+--------+--------+-------+ Left Renal ArteryPSV cm/sEDV cm/sComment +-----------------+--------+--------+-------+ Origin                             N/V   +-----------------+--------+--------+-------+ Proximal            99      12           +-----------------+--------+--------+-------+ Mid                140      19           +-----------------+--------+--------+-------+ Distal              36      10           +-----------------+--------+--------+-------+ +------------+--------+--------+----+-----------+--------+--------+----+ Right KidneyPSV cm/sEDV cm/sRI  Left KidneyPSV cm/sEDV cm/sRI   +------------+--------+--------+----+-----------+--------+--------+----+ Upper Pole  11      5       0.53Upper Pole 13      5       0.59 +------------+--------+--------+----+-----------+--------+--------+----+ Mid         11      8       0.        10      7       0.32 +------------+--------+--------+----+-----------+--------+--------+----+ Lower Pole  23      10      0.57Lower Pole 16      8       0.51 +------------+--------+--------+----+-----------+--------+--------+----+ Hilar       13      8       0.37Hilar      17      8       0.56 +------------+--------+--------+----+-----------+--------+--------+----+ +------------------+-----+------------------+----+ Right Kidney           Left Kidney            +------------------+-----+------------------+----+ RAR                    RAR                    +------------------+-----+------------------+----+  RAR (manual)      1.1  RAR (manual)      1.0   +------------------+-----+------------------+----+ Cortex                 Cortex                 +------------------+-----+------------------+----+ Cortex thickness       Corex thickness        +------------------+-----+------------------+----+ Kidney length (cm)10.27Kidney length (cm)9.78 +------------------+-----+------------------+----+  Summary: Renal:  Right: Normal size right kidney. Normal right Resistive Index. 1-59%        stenosis of the right renal artery. RRV flow present. Left:  Normal size of left kidney. Normal left Resistive Index. No        evidence of left renal artery stenosis. LRV flow present. Mesenteric: Normal Celiac artery and Superior Mesenteric artery findings.  *See table(s) above for measurements and observations.  Diagnosing physician: Lonni Gaskins MD  Electronically signed by Lonni Gaskins MD on 07/30/2024 at 8:04:48 AM.    Final    ECHOCARDIOGRAM COMPLETE Result Date: 07/28/2024    ECHOCARDIOGRAM REPORT   Patient Name:   ASHUR GLATFELTER Date of Exam: 07/28/2024 Medical Rec #:  [id number]       Height:       71.0 in Accession #:    [id number]      Weight:       226.0 lb Date of Birth:  1986/02/10       BSA:          2.221 m Patient Age:    38 years        BP:           165/112 mmHg Patient Gender: M               HR:           112 bpm. Exam Location:  Inpatient Procedure: 2D Echo, Intracardiac Opacification Agent, Color Doppler and Cardiac            Doppler (Both Spectral and Color Flow Doppler were utilized during            procedure). Indications:    Dyspnea R06.00  History:        Patient has no prior history of Echocardiogram examinations.  Sonographer:    [name] Referring Phys: [id number] VINCENT A DELGADO IMPRESSIONS  1. Prominent apical and lateral trabeculations with definity . Consider f/u cardiac MRI to quantitate EF and r/o ventricular non compaction. . Left ventricular ejection fraction, by estimation, is 35 to 40%. The left ventricle has  moderately decreased function. The left ventricle demonstrates global hypokinesis. The left ventricular internal cavity size was moderately dilated. There is mild left ventricular hypertrophy. Left ventricular diastolic parameters were normal.  2. Right ventricular systolic function is normal. The right ventricular size is normal.  3. Left atrial size was severely dilated.  4. Right atrial size was moderately dilated.  5. The mitral valve is abnormal. Mild mitral valve regurgitation. No evidence of mitral stenosis.  6. The aortic valve is tricuspid. There is mild calcification of the aortic valve. There is mild thickening of the aortic valve. Aortic valve regurgitation is not visualized. Aortic valve sclerosis is present, with no evidence of aortic valve stenosis.  7. The inferior vena cava is normal in size with greater than 50% respiratory variability, suggesting right atrial pressure of 3 mmHg. FINDINGS  Left Ventricle: Prominent apical and lateral trabeculations with definity . Consider f/u cardiac MRI to quantitate EF  and r/o ventricular non compaction. Left ventricular ejection fraction, by estimation, is 35 to 40%. The left ventricle has moderately decreased function. The left ventricle demonstrates global hypokinesis. Strain was performed and the global longitudinal strain is indeterminate. The left ventricular internal cavity size was moderately dilated. There is mild left ventricular hypertrophy. Left ventricular diastolic parameters were normal. Right Ventricle: The right ventricular size is normal. No increase in right ventricular wall thickness. Right ventricular systolic function is normal. Left Atrium: Left atrial size was severely dilated. Right Atrium: Right atrial size was moderately dilated. Pericardium: There is no evidence of pericardial effusion. Mitral Valve: The mitral valve is abnormal. There is mild thickening of the mitral valve leaflet(s). Mild mitral valve regurgitation. No evidence of  mitral valve stenosis. Tricuspid Valve: The tricuspid valve is normal in structure. Tricuspid valve regurgitation is mild . No evidence of tricuspid stenosis. Aortic Valve: The aortic valve is tricuspid. There is mild calcification of the aortic valve. There is mild thickening of the aortic valve. Aortic valve regurgitation is not visualized. Aortic valve sclerosis is present, with no evidence of aortic valve stenosis. Pulmonic Valve: The pulmonic valve was normal in structure. Pulmonic valve regurgitation is trivial. No evidence of pulmonic stenosis. Aorta: The aortic root is normal in size and structure. Venous: The inferior vena cava is normal in size with greater than 50% respiratory variability, suggesting right atrial pressure of 3 mmHg. IAS/Shunts: No atrial level shunt detected by color flow Doppler. Additional Comments: 3D was performed not requiring image post processing on an independent workstation and was indeterminate.  LEFT VENTRICLE PLAX 2D LVIDd:         6.50 cm      Diastology LVIDs:         5.30 cm      LV e' medial:  14.40 cm/s LV PW:         1.30 cm      LV e' lateral: 7.62 cm/s LV IVS:        1.20 cm LVOT diam:     2.40 cm LV SV:         103 LV SV Index:   46 LVOT Area:     4.52 cm  LV Volumes (MOD) LV vol d, MOD A2C: 247.0 ml LV vol d, MOD A4C: 238.0 ml LV vol s, MOD A2C: 148.0 ml LV vol s, MOD A4C: 153.0 ml LV SV MOD A2C:     99.0 ml LV SV MOD A4C:     238.0 ml LV SV MOD BP:      94.2 ml RIGHT VENTRICLE             IVC RV S prime:     13.90 cm/s  IVC diam: 1.40 cm TAPSE (M-mode): 2.3 cm LEFT ATRIUM           Index        RIGHT ATRIUM           Index LA diam:      5.40 cm 2.43 cm/m   RA Area:     22.60 cm LA Vol (A4C): 81.1 ml 36.52 ml/m  RA Volume:   63.90 ml  28.77 ml/m  AORTIC VALVE LVOT Vmax:   135.00 cm/s LVOT Vmean:  96.900 cm/s LVOT VTI:    0.228 m  AORTA Ao Root diam: 3.60 cm Ao Asc diam:  3.50 cm  SHUNTS Systemic VTI:  0.23 m Systemic Diam: 2.40 cm Maude Emmer MD Electronically  signed by Maude  Nishan MD Signature Date/Time: 07/28/2024/12:14:38 PM    Final      Medications:     Scheduled Medications:  amLODipine   5 mg Oral Daily   furosemide   40 mg Oral Daily   heparin   5,000 Units Subcutaneous Q8H   hydrALAZINE   100 mg Oral Q8H   isosorbide  dinitrate  40 mg Oral Q8H   sodium chloride  flush  3 mL Intravenous Q12H    Infusions:   PRN Medications: acetaminophen , hydrALAZINE , sodium chloride  flush   Assessment/Plan:   Acute HFrEF - Echo EF 35-40%, apical and lateral trabeculations with definity , RV okay 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 Amyloid labs sent - pending (suspicion not very high for this). 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  - Volume improved. Now off IV lasix . Nephrology started 40 mg lasix  PO daily - GDMT limited by CKD (Scr > 6) - BP still high. Add Coreg  - Continue hydralazine  100 TID + isordil  40 TID - Need to aggressively manage sleep apnea   Hypertesnive crisis -Suspect longstanding uncontrolled HTN.  Untreated OSA likely playing a role -Renal artery duplex with no significant renal artery stenosis. Aldosterone/renin ration and urine catecholamines/metanephrines pending -Currently on hydralazine  100 TID, isordil  40 TID and amlodipine  5 mg daily. Added coreg . - Goal SBP 140-160 for now. 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 -Scr low 2s in 2023, > 6 on admission -Suspect 2/2 HTN -Nephrology following and completing workup   Iron  deficiency anemia - T sat 7% and ferritin 203 - IV iron  today (no cMRI for now)   Abnormal thyroid function study - Low TSH (0.321), Free T4 mildly elevated (1.54) and FreeT3 normal   OSA -Has been off CPAP for years after machine malfunctioned -Will need outpatient sleep study  Hypokalemia - Supp K today  Length of Stay: 3   FINCH, LINDSAY N MD 07/30/2024, 9:20  AM  Advanced Heart Failure Team Pager [phone] (M-F; 7a - 4p)  Please contact [facility] for night-coverage after hours (4p -7a ) and weekends on [email or web address]  Patient seen and examined with the above-signed Advanced Practice Provider and/or Housestaff. I personally reviewed laboratory data, imaging studies and relevant notes. I independently examined the patient and formulated the important aspects of the plan. I have edited the note to reflect any of my changes or salient points. I have personally discussed the plan with the patient and/or family.  Remains very frustrated over his situation. Denis CP or SOB.   SBP still high  General:  Sitting up on side of bed. No resp difficulty HEENT: normal Neck: supple. no JVD. Carotids 2+ bilat; no bruits. No lymphadenopathy or thryomegaly appreciated. Cor: PMI nondisplaced. Regular rate & rhythm. No rubs, gallops or murmurs. Lungs: clear Abdomen: soft, nontender, nondistended. No hepatosplenomegaly. No bruits or masses. Good bowel sounds. Extremities: no cyanosis, clubbing, rash, edema Neuro: alert & orientedx3, cranial nerves grossly intact. moves all 4 extremities w/o difficulty. Affect pleasant  Volume status looks good. Now on po lasix . 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 Not cath candidate with CKD.  Will add low-dose carvedilol  as part of GDMT. BP management per Renal.   AHF will sign off and arrange outpatient f/u.  Toribio Fuel, MD  12:00 PM

## 2024-07-30 NOTE — Progress Notes (Signed)
 Mobility Specialist Progress Note:    07/30/24 1630  Mobility  Activity Ambulated independently  Level of Assistance Independent  Assistive Device None  Distance Ambulated (ft) 500 ft  Activity Response Tolerated well  Mobility Referral Yes  Mobility visit 1 Mobility  Mobility Specialist Start Time (ACUTE ONLY) 1630  Mobility Specialist Stop Time (ACUTE ONLY) 1635  Mobility Specialist Time Calculation (min) (ACUTE ONLY) 5 min   Received pt ambulating pleasant and agreeable to session. No c/o any symptoms. Returned pt to room w/ all needs met.   Venetia Keel Mobility Specialist Please Neurosurgeon or Rehab Office at [phone]

## 2024-07-30 NOTE — Plan of Care (Signed)
  Problem: Clinical Measurements: Goal: Ability to maintain clinical measurements within normal limits will improve Outcome: Not Progressing  Blood pressures continue to be high

## 2024-07-30 NOTE — Progress Notes (Signed)
 Patient/family states he did not eat dinner; dinner tray called.

## 2024-07-30 NOTE — Progress Notes (Signed)
  [facility]ESS [facility]  Assessment/ Plan: Pt is a 38 y.o. yo male  with a past medical history significant for hypertension, dyslipidemia, CKD stage IIIb who was presented with shortness of breath and orthopnea seen as a consultation for the evaluation of AKI on CKD.   # Acute kidney injury on CKD 3B versus progressive CKD5 in the setting of uncontrolled hypertension and CHF.  The creatinine level was 2.22 in 05/2022 and then no follow-up labs until this hospitalization with creatinine level 6.5 and hypertensive emergency.  This is most likely progressive CKD. UA with proteinuria, no RBC. UPCR around 1 g.  Hep B, hep C, HIV , ANA negative, protein electrophoresis pending.  Doppler kidney ultrasound with no RAS and normal-sized kidney. Treated with IV diuretics with improvement of volume and symptoms.  Noted creatinine level further worsening today.  He is likely euvolemic now.  I am going to discontinue IV diuretics and start oral furosemide . No need for dialysis at this time.  Continue to avoid ACE inhibitor, ARBor SGLT2I.  I have discussed with the patient that he may need dialysis if no improvement in renal function. - Strict ins and outs and daily.   # Acute CHF exacerbation/orthopnea/acute mild pulmonary edema: Treated with IV diuretics now switching to oral.  Managing by cardiologist.  May need cath.    # Hypertensive emergency: Currently on multiple cardiac medications and diuretics with improvement of volume status and BP.   # Anemia chronic illness/CKD: Iron  saturation 7% with low hemoglobin.  Holding iron  per pharmacist.  Reportedly heart failure team is considering cardiac MRI and iron  will interfere with the results.     # Hypokalemia: Continue to replete potassium chloride  and monitor lab.  Subjective: Seen and examined at bedside.  Urine output is not documented.  Patient reported that his breathing is much better.  Denies nausea, vomiting, dysgeusia,  chest pain or shortness of breath.   Objective Vital signs in last 24 hours: Vitals:   07/30/24 0633 07/30/24 0742 07/30/24 0744 07/30/24 0751  BP: (!) 158/97 (!) 164/107 (!) 164/107 (!) 164/99  Pulse:   (!) 118 100  Resp:    18  Temp:   99.1 F (37.3 C) 98.6 F (37 C)  TempSrc:   Oral Oral  SpO2:   97%   Weight:      Height:       Weight change:  No intake or output data in the 24 hours ending 07/30/24 0954      Labs: RENAL PANEL Recent Labs  Lab 07/27/24 1632 07/27/24 1853 07/28/24 0349 07/29/24 0333 07/30/24 0250  NA 135  --  135 135 134*  K 3.3*  --  2.8* 2.7* 3.1*  CL 96*  --  97* 96* 97*  CO2 24  --  24 21* 22  GLUCOSE 114*  --  102* 110* 127*  BUN 57*  --  57* 50* 49*  CREATININE 6.34*  --  6.51* 6.24* 6.71*  CALCIUM 9.4  --  8.8* 9.3 9.3  MG  --   --  2.1 2.0 2.1  ALBUMIN  --  3.9  --   --   --     Liver Function Tests: Recent Labs  Lab 07/27/24 1853  AST 20  ALT 30  ALKPHOS 101  BILITOT 0.4  PROT 7.3  ALBUMIN 3.9   No results for input(s): LIPASE, AMYLASE in the last 168 hours. No results for input(s): AMMONIA in the last 168 hours.  CBC: Recent Labs    07/27/24 1632 07/28/24 0349 07/29/24 0333 07/30/24 0250  HGB 9.8* 9.7* 10.1* 9.7*  MCV 85.8 87.1 84.9 85.6  FERRITIN  --  205  --   --   TIBC  --  300  --   --   IRON   --  20*  --   --     Cardiac Enzymes: No results for input(s): CKTOTAL, CKMB, CKMBINDEX, TROPONINI in the last 168 hours. CBG: No results for input(s): GLUCAP in the last 168 hours.  Iron  Studies:  Recent Labs    07/28/24 0349  IRON  20*  TIBC 300  FERRITIN 205   Studies/Results: VAS US  RENAL ARTERY DUPLEX Result Date: 07/30/2024 ABDOMINAL VISCERAL Patient Name:  Raquel Aguiniga  Date of Exam:   07/29/2024 Medical Rec #: [id number]        Accession #:    [id number] Date of Birth: 1986/08/08        Patient Gender: M Patient Age:   68 years Exam Location:  [facility] Procedure:      VAS US   RENAL ARTERY DUPLEX Referring Phys: [id number] TRACI R TURNER -------------------------------------------------------------------------------- High Risk Factors: Hypertension, past history of smoking. Other Factors: CKD. Limitations: Obesity, air/bowel gas, patient discomfort and Labored breathing. Performing Technologist: Edilia Elden Appl  Examination Guidelines: A complete evaluation includes B-mode imaging, spectral Doppler, color Doppler, and power Doppler as needed of all accessible portions of each vessel. Bilateral testing is considered an integral part of a complete examination. Limited examinations for reoccurring indications may be performed as noted.  Duplex Findings: +----------------------+--------+--------+------+--------+ Mesenteric            PSV cm/sEDV cm/sPlaqueComments +----------------------+--------+--------+------+--------+ Aorta Mid               142      19                  +----------------------+--------+--------+------+--------+ Celiac Artery Proximal  111      25                  +----------------------+--------+--------+------+--------+ SMA Proximal            199      26                  +----------------------+--------+--------+------+--------+    +------------------+--------+--------+-------+ Right Renal ArteryPSV cm/sEDV cm/sComment +------------------+--------+--------+-------+ Origin               95      18           +------------------+--------+--------+-------+ Proximal            138      19           +------------------+--------+--------+-------+ Mid                 160      20           +------------------+--------+--------+-------+ Distal               17      5            +------------------+--------+--------+-------+ +-----------------+--------+--------+-------+ Left Renal ArteryPSV cm/sEDV cm/sComment +-----------------+--------+--------+-------+ Origin                             N/V    +-----------------+--------+--------+-------+ Proximal            99      12           +-----------------+--------+--------+-------+  Mid                140      19           +-----------------+--------+--------+-------+ Distal              36      10           +-----------------+--------+--------+-------+ +------------+--------+--------+----+-----------+--------+--------+----+ Right KidneyPSV cm/sEDV cm/sRI  Left KidneyPSV cm/sEDV cm/sRI   +------------+--------+--------+----+-----------+--------+--------+----+ Upper Pole  11      5       0.53Upper Pole 13      5       0.59 +------------+--------+--------+----+-----------+--------+--------+----+ Mid         11      8       0.        10      7       0.32 +------------+--------+--------+----+-----------+--------+--------+----+ Lower Pole  23      10      0.57Lower Pole 16      8       0.51 +------------+--------+--------+----+-----------+--------+--------+----+ Hilar       13      8       0.37Hilar      17      8       0.56 +------------+--------+--------+----+-----------+--------+--------+----+ +------------------+-----+------------------+----+ Right Kidney           Left Kidney            +------------------+-----+------------------+----+ RAR                    RAR                    +------------------+-----+------------------+----+ RAR (manual)      1.1  RAR (manual)      1.0  +------------------+-----+------------------+----+ Cortex                 Cortex                 +------------------+-----+------------------+----+ Cortex thickness       Corex thickness        +------------------+-----+------------------+----+ Kidney length (cm)10.27Kidney length (cm)9.78 +------------------+-----+------------------+----+  Summary: Renal:  Right: Normal size right kidney. Normal right Resistive Index. 1-59%        stenosis of the right renal artery. RRV flow present. Left:  Normal  size of left kidney. Normal left Resistive Index. No        evidence of left renal artery stenosis. LRV flow present. Mesenteric: Normal Celiac artery and Superior Mesenteric artery findings.  *See table(s) above for measurements and observations.  Diagnosing physician: Lonni Gaskins MD  Electronically signed by Lonni Gaskins MD on 07/30/2024 at 8:04:48 AM.    Final    ECHOCARDIOGRAM COMPLETE Result Date: 07/28/2024    ECHOCARDIOGRAM REPORT   Patient Name:   Guillaume Lapka Date of Exam: 07/28/2024 Medical Rec #:  [id number]       Height:       71.0 in Accession #:    [id number]      Weight:       226.0 lb Date of Birth:  [birth year]       BSA:          2.221 m Patient Age:    38 years        BP:           165/112 mmHg Patient Gender: M  HR:           112 bpm. Exam Location:  Inpatient Procedure: 2D Echo, Intracardiac Opacification Agent, Color Doppler and Cardiac            Doppler (Both Spectral and Color Flow Doppler were utilized during            procedure). Indications:    Dyspnea R06.00  History:        Patient has no prior history of Echocardiogram examinations.  Sonographer:    [name] Referring Phys: [id number] VINCENT A DELGADO IMPRESSIONS  1. Prominent apical and lateral trabeculations with definity . Consider f/u cardiac MRI to quantitate EF and r/o ventricular non compaction. . Left ventricular ejection fraction, by estimation, is 35 to 40%. The left ventricle has moderately decreased function. The left ventricle demonstrates global hypokinesis. The left ventricular internal cavity size was moderately dilated. There is mild left ventricular hypertrophy. Left ventricular diastolic parameters were normal.  2. Right ventricular systolic function is normal. The right ventricular size is normal.  3. Left atrial size was severely dilated.  4. Right atrial size was moderately dilated.  5. The mitral valve is abnormal. Mild mitral valve regurgitation. No evidence of mitral stenosis.  6. The  aortic valve is tricuspid. There is mild calcification of the aortic valve. There is mild thickening of the aortic valve. Aortic valve regurgitation is not visualized. Aortic valve sclerosis is present, with no evidence of aortic valve stenosis.  7. The inferior vena cava is normal in size with greater than 50% respiratory variability, suggesting right atrial pressure of 3 mmHg. FINDINGS  Left Ventricle: Prominent apical and lateral trabeculations with definity . Consider f/u cardiac MRI to quantitate EF and r/o ventricular non compaction. Left ventricular ejection fraction, by estimation, is 35 to 40%. The left ventricle has moderately decreased function. The left ventricle demonstrates global hypokinesis. Strain was performed and the global longitudinal strain is indeterminate. The left ventricular internal cavity size was moderately dilated. There is mild left ventricular hypertrophy. Left ventricular diastolic parameters were normal. Right Ventricle: The right ventricular size is normal. No increase in right ventricular wall thickness. Right ventricular systolic function is normal. Left Atrium: Left atrial size was severely dilated. Right Atrium: Right atrial size was moderately dilated. Pericardium: There is no evidence of pericardial effusion. Mitral Valve: The mitral valve is abnormal. There is mild thickening of the mitral valve leaflet(s). Mild mitral valve regurgitation. No evidence of mitral valve stenosis. Tricuspid Valve: The tricuspid valve is normal in structure. Tricuspid valve regurgitation is mild . No evidence of tricuspid stenosis. Aortic Valve: The aortic valve is tricuspid. There is mild calcification of the aortic valve. There is mild thickening of the aortic valve. Aortic valve regurgitation is not visualized. Aortic valve sclerosis is present, with no evidence of aortic valve stenosis. Pulmonic Valve: The pulmonic valve was normal in structure. Pulmonic valve regurgitation is trivial. No  evidence of pulmonic stenosis. Aorta: The aortic root is normal in size and structure. Venous: The inferior vena cava is normal in size with greater than 50% respiratory variability, suggesting right atrial pressure of 3 mmHg. IAS/Shunts: No atrial level shunt detected by color flow Doppler. Additional Comments: 3D was performed not requiring image post processing on an independent workstation and was indeterminate.  LEFT VENTRICLE PLAX 2D LVIDd:         6.50 cm      Diastology LVIDs:         5.30 cm  LV e' medial:  14.40 cm/s LV PW:         1.30 cm      LV e' lateral: 7.62 cm/s LV IVS:        1.20 cm LVOT diam:     2.40 cm LV SV:         103 LV SV Index:   46 LVOT Area:     4.52 cm  LV Volumes (MOD) LV vol d, MOD A2C: 247.0 ml LV vol d, MOD A4C: 238.0 ml LV vol s, MOD A2C: 148.0 ml LV vol s, MOD A4C: 153.0 ml LV SV MOD A2C:     99.0 ml LV SV MOD A4C:     238.0 ml LV SV MOD BP:      94.2 ml RIGHT VENTRICLE             IVC RV S prime:     13.90 cm/s  IVC diam: 1.40 cm TAPSE (M-mode): 2.3 cm LEFT ATRIUM           Index        RIGHT ATRIUM           Index LA diam:      5.40 cm 2.43 cm/m   RA Area:     22.60 cm LA Vol (A4C): 81.1 ml 36.52 ml/m  RA Volume:   63.90 ml  28.77 ml/m  AORTIC VALVE LVOT Vmax:   135.00 cm/s LVOT Vmean:  96.900 cm/s LVOT VTI:    0.228 m  AORTA Ao Root diam: 3.60 cm Ao Asc diam:  3.50 cm  SHUNTS Systemic VTI:  0.23 m Systemic Diam: 2.40 cm Maude Emmer MD Electronically signed by Maude Emmer MD Signature Date/Time: 07/28/2024/12:14:38 PM    Final     Medications: Infusions:   Scheduled Medications:  amLODipine   5 mg Oral Daily   carvedilol   3.125 mg Oral BID WC   furosemide   40 mg Oral Daily   heparin   5,000 Units Subcutaneous Q8H   hydrALAZINE   100 mg Oral Q8H   isosorbide  dinitrate  40 mg Oral Q8H   sodium chloride  flush  3 mL Intravenous Q12H    have reviewed scheduled and prn medications.  Physical Exam: General:NAD, comfortable Heart:RRR, s1s2 nl Lungs:clear b/l,  no crackle Abdomen:soft, Non-tender, non-distended Extremities:No edema Neurology: Alert, awake and following commands  Thora Scherman Prasad Brysyn Brandenberger 07/30/2024,9:54 AM  LOS: 3 days

## 2024-07-31 ENCOUNTER — Inpatient Hospital Stay (HOSPITAL_COMMUNITY): Payer: MEDICAID

## 2024-07-31 LAB — CBC
HCT: 27.3 % — ABNORMAL LOW (ref 39.0–52.0)
Hemoglobin: 9 g/dL — ABNORMAL LOW (ref 13.0–17.0)
MCH: 28.5 pg (ref 26.0–34.0)
MCHC: 33 g/dL (ref 30.0–36.0)
MCV: 86.4 fL (ref 80.0–100.0)
Platelets: 449 K/uL — ABNORMAL HIGH (ref 150–400)
RBC: 3.16 MIL/uL — ABNORMAL LOW (ref 4.22–5.81)
RDW: 13.1 % (ref 11.5–15.5)
WBC: 10.1 K/uL (ref 4.0–10.5)
nRBC: 0 % (ref 0.0–0.2)

## 2024-07-31 LAB — CORTISOL, URINE, FREE
Cortisol (Ur), Free: 81 ug/(24.h) — ABNORMAL HIGH (ref 5–64)
Cortisol,F,ug/L,U: 23 ug/L

## 2024-07-31 LAB — BASIC METABOLIC PANEL WITH GFR
Anion gap: 15 (ref 5–15)
BUN: 51 mg/dL — ABNORMAL HIGH (ref 6–20)
CO2: 22 mmol/L (ref 22–32)
Calcium: 9.2 mg/dL (ref 8.9–10.3)
Chloride: 96 mmol/L — ABNORMAL LOW (ref 98–111)
Creatinine, Ser: 7.27 mg/dL — ABNORMAL HIGH (ref 0.61–1.24)
GFR, Estimated: 9 mL/min — ABNORMAL LOW (ref >60)
Glucose, Bld: 111 mg/dL — ABNORMAL HIGH (ref 70–99)
Potassium: 3 mmol/L — ABNORMAL LOW (ref 3.5–5.1)
Sodium: 133 mmol/L — ABNORMAL LOW (ref 135–145)

## 2024-07-31 LAB — ALBUMIN: Albumin: 3.3 g/dL — ABNORMAL LOW (ref 3.5–5.0)

## 2024-07-31 LAB — MAGNESIUM: Magnesium: 2.1 mg/dL (ref 1.7–2.4)

## 2024-07-31 MED ORDER — HYDRALAZINE HCL 20 MG/ML IJ SOLN
20.0000 mg | INTRAMUSCULAR | Status: DC | PRN
  Administered 2024-08-01 (×2): 20 mg via INTRAVENOUS
  Filled 2024-07-31 (×2): qty 1

## 2024-07-31 MED ORDER — DARBEPOETIN ALFA 60 MCG/0.3ML IJ SOSY
60.0000 ug | PREFILLED_SYRINGE | INTRAMUSCULAR | Status: DC
  Administered 2024-07-31: 60 ug via SUBCUTANEOUS
  Filled 2024-07-31 (×2): qty 0.3

## 2024-07-31 MED ORDER — POTASSIUM CHLORIDE CRYS ER 20 MEQ PO TBCR
40.0000 meq | EXTENDED_RELEASE_TABLET | ORAL | Status: AC
  Administered 2024-07-31 (×2): 40 meq via ORAL
  Filled 2024-07-31 (×2): qty 2

## 2024-07-31 NOTE — Progress Notes (Signed)
 MEWS Progress Note  Patient Details Name: Nathan Heath MRN: [id number] DOB: [birth year] Today's Date: 07/31/2024   MEWS Flowsheet Documentation:  Assess: MEWS Score Temp: 98.2 F (36.8 C) BP: (!) 163/109 MAP (mmHg): 125 Pulse Rate: (!) 121 ECG Heart Rate: (!) 120 Resp: 15 Level of Consciousness: Alert SpO2: 96 % O2 Device: Room Air Patient Activity (if Appropriate): In bed Assess: MEWS Score MEWS Temp: 0 MEWS Systolic: 0 MEWS Pulse: 2 MEWS RR: 0 MEWS LOC: 0 MEWS Score: 2 MEWS Score Color: Yellow Assess: SIRS CRITERIA SIRS Temperature : 0 SIRS Respirations : 0 SIRS Pulse: 1 SIRS WBC: 0 SIRS Score Sum : 1 SIRS Temperature : 0 SIRS Pulse: 1 SIRS Respirations : 0 SIRS WBC: 0 SIRS Score Sum : 1 Assess: if the MEWS score is Yellow or Red Were vital signs accurate and taken at a resting state?: Yes Does the patient meet 2 or more of the SIRS criteria?: No MEWS guidelines implemented : Yes, yellow Treat MEWS Interventions: Considered administering scheduled or prn medications/treatments as ordered Take Vital Signs Increase Vital Sign Frequency : Yellow: Q2hr x1, continue Q4hrs until patient remains green for 12hrs Escalate MEWS: Escalate: Yellow: Discuss with charge nurse and consider notifying provider and/or RRT Notify: Charge Nurse/RN Name of Charge Nurse/RN Notified: Deanna Provider Notification Provider Name/Title: Darci Date Provider Notified: 07/31/24 Time Provider Notified: [phone] Method of Notification: Page Notification Reason: Other (Comment) (MEWS) Provider response: No new orders Date of Provider Response: 07/31/24 Time of Provider Response: [id number]      Laneta JAYSON Rao 07/31/2024, 9:37 AM

## 2024-07-31 NOTE — Plan of Care (Signed)

## 2024-07-31 NOTE — Progress Notes (Addendum)
 [facility] PROGRESS NOTE  Assessment/ Plan: Pt is a 38 y.o. yo male  with a past medical history significant for hypertension, dyslipidemia, CKD stage IIIb who was presented with shortness of breath and orthopnea seen as a consultation for the evaluation of AKI on CKD.   # Acute kidney injury on CKD 3B versus progressive CKD5 in the setting of uncontrolled hypertension and CHF.  The creatinine level was 2.22 in 05/2022 and then no follow-up labs until this hospitalization with creatinine level 6.5 and hypertensive emergency.  UA with proteinuria, no RBC. UPCR around 1 g.  Hep B, hep C, HIV , ANA negative, k/l ratio 1.68.  Doppler kidney ultrasound with no RAS and normal-sized kidney. Treated with IV diuretics with improvement of volume and symptoms.  Noted creatinine level further worsening today.  Hold diuretics today. - Given worsening renal failure, I am going to check further serology including ANCA, anti-GBM, C3, C4 and checking kidney ultrasound to look for parenchyma echogenicity and corticomedullary junction.  -Ordering kidney biopsy for further evaluation.  I have discussed this with the patient and his mother who is a retired Engineer, civil (consulting).  We discussed about kidney biopsy complications including bleeding, infection etc.  IR consulted for kidney biopsy. -No need for dialysis today however plan to evaluat daily for dialysis need. -Continue to avoid ACE inhibitor, ARBor SGLT2I.   - Strict ins and outs and daily.   # Acute CHF exacerbation/orthopnea/acute mild pulmonary edema: Treated with IV diuretics, concern for amyloidosis.  Holding Lasix  today because of worsening AKI.   # Hypertensive emergency: Currently on multiple cardiac medications and diuretics with improvement of volume status and BP.   # Anemia chronic illness/CKD: Iron  saturation 7% with low hemoglobin.  Treated with iron , start erythropoietin.   # Hypokalemia: Continue to replete potassium chloride  and  monitor lab.  Subjective: Seen and examined at bedside.  Urine output is around 1.4 L.  Denies nausea, vomiting, chest pain, shortness of breath.  Concerned about worsening renal labs.  Patient's mother and multiple other family members at the bedside.  Objective Vital signs in last 24 hours: Vitals:   07/30/24 2308 07/31/24 0424 07/31/24 0425 07/31/24 0921  BP: (!) 150/92  (!) 163/109   Pulse: (!) 117 (!) 123 (!) 121   Resp: 18  19 15   Temp: 99.5 F (37.5 C)  99.5 F (37.5 C) 98.2 F (36.8 C)  TempSrc: Oral  Oral Oral  SpO2: 97% 98% 96%   Weight:   97.5 kg   Height:       Weight change: 1.338 kg  Intake/Output Summary (Last 24 hours) at 07/31/2024 1140 Last data filed at 07/31/2024 0900 Gross per 24 hour  Intake 1103.73 ml  Output 850 ml  Net 253.73 ml        Labs: RENAL PANEL Recent Labs  Lab 07/27/24 1632 07/27/24 1853 07/28/24 0349 07/29/24 0333 07/30/24 0250 07/31/24 0310  NA 135  --  135 135 134* 133*  K 3.3*  --  2.8* 2.7* 3.1* 3.0*  CL 96*  --  97* 96* 97* 96*  CO2 24  --  24 21* 22 22  GLUCOSE 114*  --  102* 110* 127* 111*  BUN 57*  --  57* 50* 49* 51*  CREATININE 6.34*  --  6.51* 6.24* 6.71* 7.27*  CALCIUM 9.4  --  8.8* 9.3 9.3 9.2  MG  --   --  2.1 2.0 2.1 2.1  ALBUMIN  --  3.9  --   --   --  3.3*    Liver Function Tests: Recent Labs  Lab 07/27/24 1853 07/31/24 0310  AST 20  --   ALT 30  --   ALKPHOS 101  --   BILITOT 0.4  --   PROT 7.3  --   ALBUMIN 3.9 3.3*   No results for input(s): LIPASE, AMYLASE in the last 168 hours. No results for input(s): AMMONIA in the last 168 hours. CBC: Recent Labs    07/27/24 1632 07/28/24 0349 07/29/24 0333 07/30/24 0250 07/31/24 0310  HGB 9.8* 9.7* 10.1* 9.7* 9.0*  MCV 85.8 87.1 84.9 85.6 86.4  FERRITIN  --  205  --   --   --   TIBC  --  300  --   --   --   IRON   --  20*  --   --   --     Cardiac Enzymes: No results for input(s): CKTOTAL, CKMB, CKMBINDEX, TROPONINI in the last  168 hours. CBG: No results for input(s): GLUCAP in the last 168 hours.  Iron  Studies:  No results for input(s): IRON , TIBC, TRANSFERRIN, FERRITIN in the last 72 hours.  Studies/Results: No results found.   Medications: Infusions:   Scheduled Medications:  amLODipine   5 mg Oral Daily   carvedilol   3.125 mg Oral BID WC   heparin   5,000 Units Subcutaneous Q8H   hydrALAZINE   100 mg Oral Q8H   isosorbide  dinitrate  40 mg Oral Q8H   potassium chloride   40 mEq Oral Q4H   sodium chloride  flush  3 mL Intravenous Q12H    have reviewed scheduled and prn medications.  Physical Exam: General:NAD, comfortable Heart:RRR, s1s2 nl Lungs:clear b/l, no crackle Abdomen:soft, Non-tender, non-distended Extremities:No edema Neurology: Alert, awake and following commands  Denine Brotz Prasad Jashira Cotugno 07/31/2024,11:40 AM  LOS: 4 days

## 2024-07-31 NOTE — Progress Notes (Signed)
 Progress Note   [name] FMW:[id number] DOB: 1986-01-07 DOA: 07/27/2024     4 DOS: the patient was seen and examined on 07/31/2024   Brief hospital course: Rajah Seelinger is a 38 yr old African American male with history of hypertension, CKD presented for shortness of breath and orthopnea, PND admitted to heart failure service for new onset systolic CHF started on IV diuretics with good urine output. His symptoms did improve. Nephrology consulted for renal failure and need for HD. TRH service asked to take over his care.  Assessment and Plan: Acute systolic CHF- Presented with shortness of breath, orthopnea, PND, BNP of 27,000, troponin 138. Echocardiogram reviewed shows EF 35 to 40%. Unable to perform heart cath due to his renal dysfunction. He may need cardiac amyloidosis workup, as mother is being worked up for suspicion of cardiac amyloidosis. Continue to monitor daily weights, strict input and output. Monitor daily electrolytes, renal function. CHF instructions, fluid restriction advised. Lasix  held today per nephrology. Heart failure team follow-up appreciated.  Acute worsening of CKD stage 3b- Cr 7.27 today. Kidney biopsy per nephrology team, if BP better controlled IR plan to do tomorrow. Continue to monitor urine output, daily renal function. Avoid nephrotoxic drugs including ACE inhibitors, ARB, SGLT2. Patient understands that he may need dialysis if kidney function does not improve.  Uncontrolled hypertension - Blood pressure improved.  Continue hydralazine , Isordil , Norvasc  and Coreg  IV hydralazine  as needed for elevated blood pressures ordered.  Hypokalemia- Repletion ordered. Caution as he has renal failure.  Hyponatremia- Due to hypervolemia Trend sodium.      Out of bed to chair. Incentive spirometry. Nursing supportive care. Fall, aspiration precautions. Diet:  Diet Orders (From admission, onward)     Start     Ordered   08/01/24 0001   Diet NPO time specified Except for: Sips with Meds  Diet effective midnight       Comments: For possible random renal biopsy in IR on 9/12 with moderate sedation, as long as BP under 160/90.  Question:  Except for  Answer:  Noralyn with Meds   07/31/24 1245   07/29/24 1130  Diet Heart Room service appropriate? Yes; Fluid consistency: Thin; Fluid restriction: 2000 mL Fluid  Diet effective now       Question Answer Comment  Room service appropriate? Yes   Fluid consistency: Thin   Fluid restriction: 2000 mL Fluid      07/29/24 1129           DVT prophylaxis: heparin  injection 5,000 Units Start: 07/27/24 2345  Level of care: Telemetry Cardiac   Code Status: Full Code  Subjective: Patient is seen and examined today morning.  RN at bedside placing line. He is making urine, has dyspnea. No chest pain. Eating fair.  Physical Exam: Vitals:   07/31/24 0425 07/31/24 0921 07/31/24 1158 07/31/24 1516  BP: (!) 163/109  (!) 157/119   Pulse: (!) 121  (!) 110   Resp: 19 15 14 18   Temp: 99.5 F (37.5 C) 98.2 F (36.8 C) 98.3 F (36.8 C) 98.5 F (36.9 C)  TempSrc: Oral Oral Oral Oral  SpO2: 96%  99%   Weight: 97.5 kg     Height:        General - Young African-American male, mild respiratory distress HEENT - PERRLA, EOMI, atraumatic head, non tender sinuses. Lung - Clear, basal rales, no rhonchi, wheezes. Heart - S1, S2 heard, no murmurs, rubs, trace pedal edema. Abdomen - Soft, non tender, bowel sounds  good Neuro - Alert, awake and oriented x 3, non focal exam. Skin - Warm and dry.  Data Reviewed:      Latest Ref Rng & Units 07/31/2024    3:10 AM 07/30/2024    2:50 AM 07/29/2024    3:33 AM  CBC  WBC 4.0 - 10.5 K/uL 10.1  11.0  8.7   Hemoglobin 13.0 - 17.0 g/dL 9.0  9.7  89.8   Hematocrit 39.0 - 52.0 % 27.3  29.2  29.9   Platelets 150 - 400 K/uL 449  524  527       Latest Ref Rng & Units 07/31/2024    3:10 AM 07/30/2024    2:50 AM 07/29/2024    3:33 AM  BMP  Glucose 70 - 99  mg/dL 888  872  889   BUN 6 - 20 mg/dL 51  49  50   Creatinine 0.61 - 1.24 mg/dL 2.72  3.28  3.75   Sodium 135 - 145 mmol/L 133  134  135   Potassium 3.5 - 5.1 mmol/L 3.0  3.1  2.7  C  Chloride 98 - 111 mmol/L 96  97  96   CO2 22 - 32 mmol/L 22  22  21    Calcium 8.9 - 10.3 mg/dL 9.2  9.3  9.3     C Corrected result   US  RENAL Result Date: 07/31/2024 EXAM: US  Retroperitoneum Complete, Renal. CLINICAL HISTORY: [id number] AKI (acute kidney injury) (HCC). TECHNIQUE: Real-time ultrasound of the retroperitoneum (complete) with image documentation. COMPARISON: None provided. FINDINGS: RIGHT KIDNEY: The right kidney measures 10.4 x 5.2 x 5 cm, with a calculated volume of 135.42 cc. Echogenic renal parenchyma. No hydronephrosis, renal stone, or mass visualized. LEFT KIDNEY: The left kidney measures 11 x 5.2 x 6 cm, with a calculated volume of 181.2 cc. No hydronephrosis, renal stone, or mass visualized. BLADDER: The urinary bladder is incompletely distended. Bilateral ureteral jets are demonstrated. IMPRESSION: 1. No hydronephrosis or renal stone. 2. Echogenic renal parenchyma, a nonspecific indicator of medical renal disease. Electronically signed by: Katheleen Faes MD 07/31/2024 02:21 PM EDT RP Workstation: [id number]    Family Communication: Discussed with patient, family at bedside.  They understand and agree. All questions answered.  Disposition: Status is: Inpatient Remains inpatient appropriate because: Severity of illness  Planned Discharge Destination: Home     Time spent: 46 minutes.  Author: Concepcion Riser, MD 07/31/2024 5:45 PM Secure chat 7am to 7pm For on call review [email or web address].
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
## 2024-07-31 NOTE — Progress Notes (Signed)
 Mobility Specialist: Progress Note   07/31/24 1600  Mobility  Activity Ambulated independently  Level of Assistance Independent  Assistive Device None  Distance Ambulated (ft) 700 ft  Activity Response Tolerated well  Mobility Referral Yes  Mobility visit 1 Mobility  Mobility Specialist Start Time (ACUTE ONLY) 1158  Mobility Specialist Stop Time (ACUTE ONLY) 1215  Mobility Specialist Time Calculation (min) (ACUTE ONLY) 17 min    Pt received in bed, agreeable to mobility session. Mother present. Ind throughout. C/o feeling a little winded. Returned to room without fault. Left on EOB with all needs met, call bell in reach.   Ileana Lute Mobility Specialist Please contact via SecureChat or Rehab office at [phone]

## 2024-08-01 ENCOUNTER — Other Ambulatory Visit (HOSPITAL_COMMUNITY): Payer: Self-pay

## 2024-08-01 LAB — ANCA TITERS
Atypical P-ANCA titer: <1:20 {titer}
C-ANCA: <1:20 {titer}
P-ANCA: <1:20 {titer}

## 2024-08-01 LAB — CBC
HCT: 26.4 % — ABNORMAL LOW (ref 39.0–52.0)
Hemoglobin: 8.8 g/dL — ABNORMAL LOW (ref 13.0–17.0)
MCH: 28.9 pg (ref 26.0–34.0)
MCHC: 33.3 g/dL (ref 30.0–36.0)
MCV: 86.8 fL (ref 80.0–100.0)
Platelets: 462 K/uL — ABNORMAL HIGH (ref 150–400)
RBC: 3.04 MIL/uL — ABNORMAL LOW (ref 4.22–5.81)
RDW: 13.1 % (ref 11.5–15.5)
WBC: 8.6 K/uL (ref 4.0–10.5)
nRBC: 0 % (ref 0.0–0.2)

## 2024-08-01 LAB — IMMUNOFIXATION, URINE

## 2024-08-01 LAB — BASIC METABOLIC PANEL WITH GFR
Anion gap: 13 (ref 5–15)
BUN: 52 mg/dL — ABNORMAL HIGH (ref 6–20)
CO2: 21 mmol/L — ABNORMAL LOW (ref 22–32)
Calcium: 9.1 mg/dL (ref 8.9–10.3)
Chloride: 98 mmol/L (ref 98–111)
Creatinine, Ser: 6.78 mg/dL — ABNORMAL HIGH (ref 0.61–1.24)
GFR, Estimated: 10 mL/min — ABNORMAL LOW (ref >60)
Glucose, Bld: 108 mg/dL — ABNORMAL HIGH (ref 70–99)
Potassium: 3.5 mmol/L (ref 3.5–5.1)
Sodium: 132 mmol/L — ABNORMAL LOW (ref 135–145)

## 2024-08-01 LAB — MAGNESIUM: Magnesium: 2.1 mg/dL (ref 1.7–2.4)

## 2024-08-01 LAB — ALDOSTERONE + RENIN ACTIVITY W/ RATIO
ALDO / PRA Ratio: 0.8 (ref 0.0–30.0)
Aldosterone: 11.5 ng/dL (ref 0.0–30.0)
PRA LC/MS/MS: 14.624 ng/mL/h — ABNORMAL HIGH (ref 0.167–5.380)

## 2024-08-01 LAB — C4 COMPLEMENT: Complement C4, Body Fluid: 38 mg/dL (ref 12–38)

## 2024-08-01 LAB — C3 COMPLEMENT: C3 Complement: 162 mg/dL (ref 82–167)

## 2024-08-01 LAB — GLOMERULAR BASEMENT MEMBRANE ANTIBODIES: GBM Ab: <0.2 U (ref 0.0–0.9)

## 2024-08-01 MED ORDER — AMLODIPINE BESYLATE 5 MG PO TABS
5.0000 mg | ORAL_TABLET | Freq: Once | ORAL | Status: AC
Start: 2024-08-01 — End: 2024-08-01
  Administered 2024-08-01: 5 mg via ORAL
  Filled 2024-08-01: qty 1

## 2024-08-01 MED ORDER — CARVEDILOL 6.25 MG PO TABS
6.2500 mg | ORAL_TABLET | Freq: Two times a day (BID) | ORAL | Status: DC
  Administered 2024-08-01 – 2024-08-03 (×4): 6.25 mg via ORAL
  Filled 2024-08-01 (×4): qty 1

## 2024-08-01 MED ORDER — AMLODIPINE BESYLATE 10 MG PO TABS
10.0000 mg | ORAL_TABLET | Freq: Every day | ORAL | Status: DC
  Administered 2024-08-02 – 2024-08-07 (×6): 10 mg via ORAL
  Filled 2024-08-01 (×7): qty 1

## 2024-08-01 MED ORDER — OXYMETAZOLINE HCL 0.05 % NA SOLN
1.0000 | Freq: Two times a day (BID) | NASAL | Status: AC
  Administered 2024-08-01 – 2024-08-03 (×5): 1 via NASAL
  Filled 2024-08-01: qty 30

## 2024-08-01 NOTE — Progress Notes (Signed)
 Nathan Heath PROGRESS NOTE  Assessment/ Plan: Pt is a 38 y.o. yo male  with a past medical history significant for hypertension, dyslipidemia, CKD stage IIIb who was presented with shortness of breath and orthopnea seen as a consultation for the evaluation of AKI on CKD.   # Acute kidney injury on CKD 3B versus progressive CKD5 in the setting of uncontrolled hypertension and CHF.  The creatinine level was 2.22 in 05/2022 and then no follow-up labs until this hospitalization with creatinine level 6.5 and hypertensive emergency.  UA with proteinuria, no RBC. UPCR around 1 g.  Hep B, hep C, HIV , ANA negative, k/l ratio 1.68.  Doppler US  no RAS, and echogenic kidneys in US  but normal sized kidneys.  - Pending C3, C4, ANCA, anti-GBM. -IR consulted for kidney biopsy, possible today. - Treated with IV diuretics with improvement of volume status, furosemide  held since 9/11. No signs or symptoms of uremia.  -No need for dialysis today however plan to evaluat daily for dialysis need. -Continue to avoid ACE inhibitor, ARBor SGLT2I.   - Strict ins and outs and daily.   # Acute CHF exacerbation/orthopnea/acute mild pulmonary edema: Treated with IV diuretics, concern for amyloidosis.  Continue to hold diuretics because of AKI.   # Hypertensive emergency: BP suboptimal, increase amlodipine  to 10 mg.  Continue current cardiac medication.  # Anemia chronic illness/CKD: Iron  saturation 7% with low hemoglobin.  Treated with iron , started erythropoietin.   # Hypokalemia: Continue to replete potassium chloride  and monitor lab.  Discussed with the patient's mother and other family member 9/11.  Subjective: Seen and examined at bedside.  The urine output is around 1.6 L.  Denies nausea, vomiting, chest pain, shortness of breath.  No new event overnight.   Objective Vital signs in last 24 hours: Vitals:   08/01/24 0344 08/01/24 0603 08/01/24 0628 08/01/24 0720  BP: (!) 175/126  (!)  164/114 (!) 157/93  Pulse: (!) 117   (!) 109  Resp: 18   20  Temp: 99.8 F (37.7 C)   99.1 F (37.3 C)  TempSrc: Oral   Oral  SpO2: 98%   96%  Weight:  97.7 kg    Height:       Weight change: 0.2 kg  Intake/Output Summary (Last 24 hours) at 08/01/2024 1019 Last data filed at 08/01/2024 0430 Gross per 24 hour  Intake 733 ml  Output 1400 ml  Net -667 ml        Labs: RENAL PANEL Recent Labs  Lab 07/27/24 1853 07/28/24 0349 07/29/24 0333 07/30/24 0250 07/31/24 0310 08/01/24 0238  NA  --  135 135 134* 133* 132*  K  --  2.8* 2.7* 3.1* 3.0* 3.5  CL  --  97* 96* 97* 96* 98  CO2  --  24 21* 22 22 21*  GLUCOSE  --  102* 110* 127* 111* 108*  BUN  --  57* 50* 49* 51* 52*  CREATININE  --  6.51* 6.24* 6.71* 7.27* 6.78*  CALCIUM  --  8.8* 9.3 9.3 9.2 9.1  MG  --  2.1 2.0 2.1 2.1 2.1  ALBUMIN 3.9  --   --   --  3.3*  --     Liver Function Tests: Recent Labs  Lab 07/27/24 1853 07/31/24 0310  AST 20  --   ALT 30  --   ALKPHOS 101  --   BILITOT 0.4  --   PROT 7.3  --   ALBUMIN 3.9 3.3*  No results for input(s): LIPASE, AMYLASE in the last 168 hours. No results for input(s): AMMONIA in the last 168 hours. CBC: Recent Labs    07/28/24 0349 07/29/24 0333 07/30/24 0250 07/31/24 0310 08/01/24 0238  HGB 9.7* 10.1* 9.7* 9.0* 8.8*  MCV 87.1 84.9 85.6 86.4 86.8  FERRITIN 205  --   --   --   --   TIBC 300  --   --   --   --   IRON  20*  --   --   --   --     Cardiac Enzymes: No results for input(s): CKTOTAL, CKMB, CKMBINDEX, TROPONINI in the last 168 hours. CBG: No results for input(s): GLUCAP in the last 168 hours.  Iron  Studies:  No results for input(s): IRON , TIBC, TRANSFERRIN, FERRITIN in the last 72 hours.  Studies/Results: US  RENAL Result Date: 07/31/2024 EXAM: US  Retroperitoneum Complete, Renal. CLINICAL HISTORY: 409830 AKI (acute kidney injury) (HCC). TECHNIQUE: Real-time ultrasound of the retroperitoneum (complete) with image  documentation. COMPARISON: None provided. FINDINGS: RIGHT KIDNEY: The right kidney measures 10.4 x 5.2 x 5 cm, with a calculated volume of 135.42 cc. Echogenic renal parenchyma. No hydronephrosis, renal stone, or mass visualized. LEFT KIDNEY: The left kidney measures 11 x 5.2 x 6 cm, with a calculated volume of 181.2 cc. No hydronephrosis, renal stone, or mass visualized. BLADDER: The urinary bladder is incompletely distended. Bilateral ureteral jets are demonstrated. IMPRESSION: 1. No hydronephrosis or renal stone. 2. Echogenic renal parenchyma, a nonspecific indicator of medical renal disease. Electronically signed by: Dayne Hassell MD 07/31/2024 02:21 PM EDT RP Workstation: [id number]     Medications: Infusions:   Scheduled Medications:  amLODipine   5 mg Oral Daily   carvedilol   3.125 mg Oral BID WC   darbepoetin (ARANESP ) injection - DIALYSIS  60 mcg Subcutaneous Q Thu-1800   heparin   5,000 Units Subcutaneous Q8H   hydrALAZINE   100 mg Oral Q8H   isosorbide  dinitrate  40 mg Oral Q8H   sodium chloride  flush  3 mL Intravenous Q12H    have reviewed scheduled and prn medications.  Physical Exam: General:NAD, comfortable Heart:RRR, s1s2 nl Lungs:clear b/l, no crackle Abdomen:soft, Non-tender, non-distended Extremities:No edema Neurology: Alert, awake and following commands  Omaira Mellen Prasad Miamor Ayler 08/01/2024,10:19 AM  LOS: 5 days

## 2024-08-01 NOTE — Progress Notes (Signed)
 Progress Note   [name] FMW:[id number] DOB: 1986/06/17 DOA: 07/27/2024     5 DOS: the patient was seen and examined on 08/01/2024   Brief hospital course: Nathan Heath is a 38 yr old African American male with history of hypertension, CKD presented for shortness of breath and orthopnea, PND admitted to heart failure service for new onset systolic CHF started on IV diuretics with good urine output. His symptoms did improve. Nephrology consulted for renal failure and need for HD. TRH service asked to take over his care.  Assessment and Plan: Acute systolic CHF- Presented with shortness of breath, orthopnea, PND, BNP of 27,000, troponin 138. Echocardiogram reviewed shows EF 35 to 40%. Unable to perform heart cath due to his renal dysfunction. He may need cardiac amyloidosis workup, as mother is being worked up for suspicion of cardiac amyloidosis. Continue to monitor daily weights, strict input and output. Monitor daily electrolytes, renal function. CHF instructions, fluid restriction advised. Lasix  held 07/31/24 per nephrology. Heart failure team follow-up appreciated.  Acute worsening of CKD stage 3b- Cr 6.78 today little improved from yesterday. Nephrology work up in progress for kidney failure. No HD at this time. Kidney biopsy per IR once BP better controlled. Continue to monitor urine output, daily renal function. Avoid nephrotoxic drugs including ACE inhibitors, ARB, SGLT2. Patient understands that he may need dialysis if kidney function does not improve.  Uncontrolled hypertension - Blood pressure around 170/110.  Continue hydralazine  100 q8, Isordil  40 q8, Norvasc  10 qd and increased Coreg  to 6.25 bid. IV hydralazine  as needed for elevated blood pressures ordered.  Hypokalemia- Improved with repletion.  Hyponatremia- Due to hypervolemia Stable sodium at 132. Trend daily Na.      Out of bed to chair. Incentive spirometry. Nursing supportive  care. Fall, aspiration precautions. Diet:  Diet Orders (From admission, onward)     Start     Ordered   08/01/24 0001  Diet NPO time specified Except for: Sips with Meds  Diet effective midnight       Comments: For possible random renal biopsy in IR on 9/12 with moderate sedation, as long as BP under 160/90.  Question:  Except for  Answer:  Noralyn with Meds   07/31/24 1245           DVT prophylaxis: heparin  injection 5,000 Units Start: 07/27/24 2345  Level of care: Telemetry Cardiac   Code Status: Full Code  Subjective: Patient is seen and examined today morning. Has dyspnea while talking. Did not get out of bed today. No chest pain. Eating fair. BP elevated.  Physical Exam: Vitals:   08/01/24 0720 08/01/24 1000 08/01/24 1100 08/01/24 1147  BP: (!) 157/93 (!) 170/123 (!) 157/112 (!) 170/134  Pulse: (!) 109 (!) 111 (!) 110 (!) 110  Resp: 20 20 20 18   Temp: 99.1 F (37.3 C)  98.5 F (36.9 C)   TempSrc: Oral  Oral   SpO2: 96%  98%   Weight:      Height:        General - Young African-American male, mild respiratory distress HEENT - PERRLA, EOMI, atraumatic head, non tender sinuses. Lung - Clear, basal rales, no rhonchi, wheezes. Heart - S1, S2 heard, no murmurs, rubs, trace pedal edema. Abdomen - Soft, non tender, bowel sounds good Neuro - Alert, awake and oriented x 3, non focal exam. Skin - Warm and dry.  Data Reviewed:      Latest Ref Rng & Units 08/01/2024    2:38 AM  07/31/2024    3:10 AM 07/30/2024    2:50 AM  CBC  WBC 4.0 - 10.5 K/uL 8.6  10.1  11.0   Hemoglobin 13.0 - 17.0 g/dL 8.8  9.0  9.7   Hematocrit 39.0 - 52.0 % 26.4  27.3  29.2   Platelets 150 - 400 K/uL 462  449  524       Latest Ref Rng & Units 08/01/2024    2:38 AM 07/31/2024    3:10 AM 07/30/2024    2:50 AM  BMP  Glucose 70 - 99 mg/dL 891  888  872   BUN 6 - 20 mg/dL 52  51  49   Creatinine 0.61 - 1.24 mg/dL 3.21  2.72  3.28   Sodium 135 - 145 mmol/L 132  133  134   Potassium 3.5 - 5.1  mmol/L 3.5  3.0  3.1   Chloride 98 - 111 mmol/L 98  96  97   CO2 22 - 32 mmol/L 21  22  22    Calcium 8.9 - 10.3 mg/dL 9.1  9.2  9.3    US  RENAL Result Date: 07/31/2024 EXAM: US  Retroperitoneum Complete, Renal. CLINICAL HISTORY: 409830 AKI (acute kidney injury) (HCC). TECHNIQUE: Real-time ultrasound of the retroperitoneum (complete) with image documentation. COMPARISON: None provided. FINDINGS: RIGHT KIDNEY: The right kidney measures 10.4 x 5.2 x 5 cm, with a calculated volume of 135.42 cc. Echogenic renal parenchyma. No hydronephrosis, renal stone, or mass visualized. LEFT KIDNEY: The left kidney measures 11 x 5.2 x 6 cm, with a calculated volume of 181.2 cc. No hydronephrosis, renal stone, or mass visualized. BLADDER: The urinary bladder is incompletely distended. Bilateral ureteral jets are demonstrated. IMPRESSION: 1. No hydronephrosis or renal stone. 2. Echogenic renal parenchyma, a nonspecific indicator of medical renal disease. Electronically signed by: Katheleen Faes MD 07/31/2024 02:21 PM EDT RP Workstation: [id number]    Family Communication: Discussed with patient, mother at bedside.  They understand and agree. All questions answered.  Disposition: Status is: Inpatient Remains inpatient appropriate because: Severity of illness  Planned Discharge Destination: Home     Time spent: 45 minutes.  Author: Concepcion Riser, MD 08/01/2024 1:03 PM Secure chat 7am to 7pm For on call review [email or web address].

## 2024-08-01 NOTE — TOC CM/SW Note (Addendum)
   [employer] Pharmacies please call [phone] for claim processing assistance.  Rx BIN: [id number] Rx Group: [id number] Rx PCN: [id number] Relationship Code: 1 Person Code: 01  Patient ID (MRN): [id number]     Patient Name: Nathan Heath   Patient DOB: 1986-10-31   Discharge Date: 08/01/2024  Expiration Date: (must be filled within 7 days of discharge)

## 2024-08-01 NOTE — Progress Notes (Signed)
 Interventional Radiology Brief Note:  IR following for possible random renal biopsy, however patient with ongoing elevated BPs-- 153/77 this AM with MAP 114.    No plan for biopsy today.  Will continue to monitor for adequate BP control prior to moving forward.   Korena Nass, MS RD PA-C

## 2024-08-02 LAB — MULTIPLE MYELOMA PANEL, SERUM
Albumin SerPl Elph-Mcnc: 3.5 g/dL (ref 2.9–4.4)
Albumin/Glob SerPl: 0.9 (ref 0.7–1.7)
Alpha 1: 0.4 g/dL (ref 0.0–0.4)
Alpha2 Glob SerPl Elph-Mcnc: 1 g/dL (ref 0.4–1.0)
B-Globulin SerPl Elph-Mcnc: 1.4 g/dL — ABNORMAL HIGH (ref 0.7–1.3)
Gamma Glob SerPl Elph-Mcnc: 1.2 g/dL (ref 0.4–1.8)
Globulin, Total: 3.9 g/dL (ref 2.2–3.9)
IgA: 253 mg/dL (ref 90–386)
IgG (Immunoglobin G), Serum: 1346 mg/dL (ref 603–1613)
IgM (Immunoglobulin M), Srm: 68 mg/dL (ref 20–172)
Total Protein ELP: 7.4 g/dL (ref 6.0–8.5)

## 2024-08-02 LAB — BASIC METABOLIC PANEL WITH GFR
Anion gap: 13 (ref 5–15)
BUN: 58 mg/dL — ABNORMAL HIGH (ref 6–20)
CO2: 21 mmol/L — ABNORMAL LOW (ref 22–32)
Calcium: 9.1 mg/dL (ref 8.9–10.3)
Chloride: 97 mmol/L — ABNORMAL LOW (ref 98–111)
Creatinine, Ser: 6.64 mg/dL — ABNORMAL HIGH (ref 0.61–1.24)
GFR, Estimated: 10 mL/min — ABNORMAL LOW (ref >60)
Glucose, Bld: 98 mg/dL (ref 70–99)
Potassium: 3.3 mmol/L — ABNORMAL LOW (ref 3.5–5.1)
Sodium: 131 mmol/L — ABNORMAL LOW (ref 135–145)

## 2024-08-02 LAB — CBC
HCT: 25.4 % — ABNORMAL LOW (ref 39.0–52.0)
Hemoglobin: 8.6 g/dL — ABNORMAL LOW (ref 13.0–17.0)
MCH: 29.2 pg (ref 26.0–34.0)
MCHC: 33.9 g/dL (ref 30.0–36.0)
MCV: 86.1 fL (ref 80.0–100.0)
Platelets: 430 K/uL — ABNORMAL HIGH (ref 150–400)
RBC: 2.95 MIL/uL — ABNORMAL LOW (ref 4.22–5.81)
RDW: 12.9 % (ref 11.5–15.5)
WBC: 8 K/uL (ref 4.0–10.5)
nRBC: 0 % (ref 0.0–0.2)

## 2024-08-02 MED ORDER — POTASSIUM CHLORIDE CRYS ER 20 MEQ PO TBCR
40.0000 meq | EXTENDED_RELEASE_TABLET | Freq: Two times a day (BID) | ORAL | Status: AC
  Administered 2024-08-02 (×2): 40 meq via ORAL
  Filled 2024-08-02 (×2): qty 2

## 2024-08-02 NOTE — Progress Notes (Signed)
 Progress Note   [name] FMW:[id number] DOB: [birth year] DOA: 07/27/2024     6 DOS: the patient was seen and examined on 08/02/2024   Brief hospital course: Dhiren Folks is a 38 yr old African American male with history of hypertension, CKD presented for shortness of breath and orthopnea, PND admitted to heart failure service for new onset systolic CHF started on IV diuretics with good urine output. His symptoms did improve. Nephrology consulted for renal failure and need for HD. TRH service asked to take over his care.  Assessment and Plan: Acute systolic CHF- Presented with shortness of breath, orthopnea, PND, BNP of 27,000, troponin 138. Echocardiogram reviewed shows EF 35 to 40%. Unable to perform heart cath due to his renal dysfunction. He may need cardiac amyloidosis workup, as mother is being worked up for suspicion of cardiac amyloidosis. Continue to monitor daily weights, strict input and output. Monitor daily electrolytes, renal function. CHF instructions, fluid restriction advised. Lasix  held 07/31/24 per nephrology. Heart failure team follow-up appreciated.  Acute worsening of CKD stage 3b- Cr 6.78 today little improved from yesterday. Nephrology work up in progress for kidney failure. No HD at this time. Kidney biopsy per IR once BP better controlled. BP still elevated. Continue to monitor urine output, daily renal function. Avoid nephrotoxic drugs including ACE inhibitors, ARB, SGLT2. Patient understands that he may need dialysis if kidney function does not improve.  Uncontrolled hypertension - Blood pressure around 160/110.  Continue hydralazine  100 q8, Isordil  40 q8, Norvasc  10 qd and Coreg  to 6.25 bid. IV hydralazine  as needed for elevated blood pressure.  Hypokalemia- continue repletion as needed.  Hyponatremia- Due to hypervolemia Stable sodium at 132. Trend daily Na.      Out of bed to chair. Incentive spirometry. Nursing supportive  care. Fall, aspiration precautions. Diet:  Diet Orders (From admission, onward)     Start     Ordered   08/04/24 0001  Diet NPO time specified  Diet effective midnight        08/01/24 1626   08/01/24 1604  Diet renal with fluid restriction Fluid restriction: 1200 mL Fluid; Room service appropriate? Yes; Fluid consistency: Thin  Diet effective now       Question Answer Comment  Fluid restriction: 1200 mL Fluid   Room service appropriate? Yes   Fluid consistency: Thin      08/01/24 1603           DVT prophylaxis: heparin  injection 5,000 Units Start: 07/27/24 2345  Level of care: Telemetry Cardiac   Code Status: Full Code  Subjective: Patient is seen and examined today morning. Lying in bed,states he did not sleep last night. Asks if he can go out.  BP elevated.  Physical Exam: Vitals:   08/02/24 0018 08/02/24 0432 08/02/24 0856 08/02/24 1041  BP: (!) 162/114 (!) 157/119 (!) 174/127 (!) 160/113  Pulse: (!) 117 (!) 115 (!) 103 (!) 102  Resp: 19 18 20 18   Temp: 99.6 F (37.6 C) 98.8 F (37.1 C) 98 F (36.7 C) 98.7 F (37.1 C)  TempSrc: Oral Oral Oral Oral  SpO2: 98% 98% 97% 98%  Weight:  97.9 kg    Height:        General - Young African-American male, mild respiratory distress HEENT - PERRLA, EOMI, atraumatic head, non tender sinuses. Lung - Clear, basal rales, no rhonchi, wheezes. Heart - S1, S2 heard, no murmurs, rubs, trace pedal edema. Abdomen - Soft, non tender, bowel sounds good Neuro - Alert,  awake and oriented x 3, non focal exam. Skin - Warm and dry.  Data Reviewed:      Latest Ref Rng & Units 08/02/2024    2:20 AM 08/01/2024    2:38 AM 07/31/2024    3:10 AM  CBC  WBC 4.0 - 10.5 K/uL 8.0  8.6  10.1   Hemoglobin 13.0 - 17.0 g/dL 8.6  8.8  9.0   Hematocrit 39.0 - 52.0 % 25.4  26.4  27.3   Platelets 150 - 400 K/uL 430  462  449       Latest Ref Rng & Units 08/02/2024    2:20 AM 08/01/2024    2:38 AM 07/31/2024    3:10 AM  BMP  Glucose 70 - 99 mg/dL  98  891  888   BUN 6 - 20 mg/dL 58  52  51   Creatinine 0.61 - 1.24 mg/dL 3.35  3.21  2.72   Sodium 135 - 145 mmol/L 131  132  133   Potassium 3.5 - 5.1 mmol/L 3.3  3.5  3.0   Chloride 98 - 111 mmol/L 97  98  96   CO2 22 - 32 mmol/L 21  21  22    Calcium 8.9 - 10.3 mg/dL 9.1  9.1  9.2    No results found.   Family Communication: Discussed with patient at bedside. He understand and agree. All questions answered.  Disposition: Status is: Inpatient Remains inpatient appropriate because: Severity of illness  Planned Discharge Destination: Home     Time spent: 46 minutes.  Author: Concepcion Riser, MD 08/02/2024 2:44 PM Secure chat 7am to 7pm For on call review [email or web address].

## 2024-08-02 NOTE — Progress Notes (Signed)
 [facility] NEPHROLOGY PROGRESS NOTE  Assessment/ Plan: Pt is a 38 y.o. yo male  with a past medical history significant for hypertension, dyslipidemia, CKD stage IIIb who was presented with shortness of breath and orthopnea seen as a consultation for the evaluation of AKI on CKD.   # Acute kidney injury on CKD 3B versus progressive CKD5 in the setting of uncontrolled hypertension and CHF.  The creatinine level was 2.22 in 05/2022 and then no follow-up labs until this hospitalization with creatinine level 6.5 and hypertensive emergency.  UA with proteinuria, no RBC. UPCR around 1 g.  Hep B, hep C, HIV, ANA negative, anti-GBM, ANCA, C3, C4 negative, k/l ratio 1.68.  Doppler US  no RAS, and echogenic kidneys in US  but normal sized kidneys.  - IR consulted for kidney biopsy.  Did not get biopsy yesterday for elevated BP per IR. - Treated with IV diuretics with improvement of volume status, furosemide  held since 9/11. No signs or symptoms of uremia.   -No need for dialysis today however plan to evaluat daily for dialysis need. -Continue to avoid ACE inhibitor, ARBor SGLT2I.   - Strict ins and outs and daily.   # Acute CHF exacerbation/orthopnea/acute mild pulmonary edema: Treated with IV diuretics, concern for amyloidosis.  Continue to hold diuretics because of AKI.   # Hypertensive emergency: BP suboptimal, therefore the medications were adjusted.  Continue amlodipine , carvedilol , hydralazine , Isordil .  It seems like anxiety is playing some role.  Continue to monitor BP.    # Anemia chronic illness/CKD: Iron  saturation 7% with low hemoglobin.  Treated with iron , started erythropoietin.   # Hypokalemia: Continue to replete potassium chloride  and monitor lab.  # Hyponatremia: Reduced free water intake.  Monitor lab.  Discussed with the patient's mother and other family member 9/11.  Subjective: Seen and examined at bedside.  Denies nausea, vomiting, chest pain, shortness of breath.   No family member at the bedside today.  Objective Vital signs in last 24 hours: Vitals:   08/02/24 0018 08/02/24 0432 08/02/24 0856 08/02/24 1041  BP: (!) 162/114 (!) 157/119 (!) 174/127 (!) 160/113  Pulse: (!) 117 (!) 115 (!) 103 (!) 102  Resp: 19 18 20 18   Temp: 99.6 F (37.6 C) 98.8 F (37.1 C) 98 F (36.7 C) 98.7 F (37.1 C)  TempSrc: Oral Oral Oral Oral  SpO2: 98% 98% 97% 98%  Weight:  97.9 kg    Height:       Weight change: 0.2 kg  Intake/Output Summary (Last 24 hours) at 08/02/2024 1118 Last data filed at 08/02/2024 1052 Gross per 24 hour  Intake --  Output 400 ml  Net -400 ml        Labs: RENAL PANEL Recent Labs  Lab 07/27/24 1853 07/28/24 0349 07/29/24 0333 07/30/24 0250 07/31/24 0310 08/01/24 0238 08/02/24 0220  NA  --  135 135 134* 133* 132* 131*  K  --  2.8* 2.7* 3.1* 3.0* 3.5 3.3*  CL  --  97* 96* 97* 96* 98 97*  CO2  --  24 21* 22 22 21* 21*  GLUCOSE  --  102* 110* 127* 111* 108* 98  BUN  --  57* 50* 49* 51* 52* 58*  CREATININE  --  6.51* 6.24* 6.71* 7.27* 6.78* 6.64*  CALCIUM  --  8.8* 9.3 9.3 9.2 9.1 9.1  MG  --  2.1 2.0 2.1 2.1 2.1  --   ALBUMIN 3.9  --   --   --  3.3*  --   --  Liver Function Tests: Recent Labs  Lab 07/27/24 1853 07/31/24 0310  AST 20  --   ALT 30  --   ALKPHOS 101  --   BILITOT 0.4  --   PROT 7.3  --   ALBUMIN 3.9 3.3*   No results for input(s): LIPASE, AMYLASE in the last 168 hours. No results for input(s): AMMONIA in the last 168 hours. CBC: Recent Labs    07/28/24 0349 07/29/24 0333 07/30/24 0250 07/31/24 0310 08/01/24 0238 08/02/24 0220  HGB 9.7* 10.1* 9.7* 9.0* 8.8* 8.6*  MCV 87.1 84.9 85.6 86.4 86.8 86.1  FERRITIN 205  --   --   --   --   --   TIBC 300  --   --   --   --   --   IRON  20*  --   --   --   --   --     Cardiac Enzymes: No results for input(s): CKTOTAL, CKMB, CKMBINDEX, TROPONINI in the last 168 hours. CBG: No results for input(s): GLUCAP in the last 168  hours.  Iron  Studies:  No results for input(s): IRON , TIBC, TRANSFERRIN, FERRITIN in the last 72 hours.  Studies/Results: US  RENAL Result Date: 07/31/2024 EXAM: US  Retroperitoneum Complete, Renal. CLINICAL HISTORY: 409830 AKI (acute kidney injury) (HCC). TECHNIQUE: Real-time ultrasound of the retroperitoneum (complete) with image documentation. COMPARISON: None provided. FINDINGS: RIGHT KIDNEY: The right kidney measures 10.4 x 5.2 x 5 cm, with a calculated volume of 135.42 cc. Echogenic renal parenchyma. No hydronephrosis, renal stone, or mass visualized. LEFT KIDNEY: The left kidney measures 11 x 5.2 x 6 cm, with a calculated volume of 181.2 cc. No hydronephrosis, renal stone, or mass visualized. BLADDER: The urinary bladder is incompletely distended. Bilateral ureteral jets are demonstrated. IMPRESSION: 1. No hydronephrosis or renal stone. 2. Echogenic renal parenchyma, a nonspecific indicator of medical renal disease. Electronically signed by: Dayne Hassell MD 07/31/2024 02:21 PM EDT RP Workstation: [id number]     Medications: Infusions:   Scheduled Medications:  amLODipine   10 mg Oral Daily   carvedilol   6.25 mg Oral BID WC   darbepoetin (ARANESP ) injection - DIALYSIS  60 mcg Subcutaneous Q Thu-1800   heparin   5,000 Units Subcutaneous Q8H   hydrALAZINE   100 mg Oral Q8H   isosorbide  dinitrate  40 mg Oral Q8H   oxymetazoline   1 spray Each Nare BID   sodium chloride  flush  3 mL Intravenous Q12H    have reviewed scheduled and prn medications.  Physical Exam: General:NAD, comfortable Heart:RRR, s1s2 nl Lungs:clear b/l, no crackle Abdomen:soft, Non-tender, non-distended Extremities:No edema Neurology: Alert, awake and following commands  Danique Hartsough Amelie Romney 08/02/2024,11:18 AM  LOS: 6 days

## 2024-08-02 NOTE — Plan of Care (Signed)

## 2024-08-03 LAB — CBC
HCT: 25.2 % — ABNORMAL LOW (ref 39.0–52.0)
Hemoglobin: 8.5 g/dL — ABNORMAL LOW (ref 13.0–17.0)
MCH: 29.1 pg (ref 26.0–34.0)
MCHC: 33.7 g/dL (ref 30.0–36.0)
MCV: 86.3 fL (ref 80.0–100.0)
Platelets: 422 K/uL — ABNORMAL HIGH (ref 150–400)
RBC: 2.92 MIL/uL — ABNORMAL LOW (ref 4.22–5.81)
RDW: 13.1 % (ref 11.5–15.5)
WBC: 7.3 K/uL (ref 4.0–10.5)
nRBC: 0 % (ref 0.0–0.2)

## 2024-08-03 LAB — BASIC METABOLIC PANEL WITH GFR
Anion gap: 13 (ref 5–15)
BUN: 60 mg/dL — ABNORMAL HIGH (ref 6–20)
CO2: 22 mmol/L (ref 22–32)
Calcium: 9.1 mg/dL (ref 8.9–10.3)
Chloride: 97 mmol/L — ABNORMAL LOW (ref 98–111)
Creatinine, Ser: 6.47 mg/dL — ABNORMAL HIGH (ref 0.61–1.24)
GFR, Estimated: 10 mL/min — ABNORMAL LOW (ref >60)
Glucose, Bld: 115 mg/dL — ABNORMAL HIGH (ref 70–99)
Potassium: 3.8 mmol/L (ref 3.5–5.1)
Sodium: 132 mmol/L — ABNORMAL LOW (ref 135–145)

## 2024-08-03 LAB — IRON AND TIBC
Iron: 39 ug/dL — ABNORMAL LOW (ref 45–182)
Saturation Ratios: 12 % — ABNORMAL LOW (ref 17.9–39.5)
TIBC: 315 ug/dL (ref 250–450)
UIBC: 276 ug/dL

## 2024-08-03 LAB — RETICULOCYTES
Immature Retic Fract: 20.9 % — ABNORMAL HIGH (ref 2.3–15.9)
RBC.: 2.96 MIL/uL — ABNORMAL LOW (ref 4.22–5.81)
Retic Count, Absolute: 47.7 K/uL (ref 19.0–186.0)
Retic Ct Pct: 1.6 % (ref 0.4–3.1)

## 2024-08-03 LAB — FERRITIN: Ferritin: 476 ng/mL — ABNORMAL HIGH (ref 24–336)

## 2024-08-03 LAB — FOLATE: Folate: 9.8 ng/mL (ref >5.9)

## 2024-08-03 LAB — VITAMIN B12: Vitamin B-12: 672 pg/mL (ref 180–914)

## 2024-08-03 MED ORDER — CARVEDILOL 12.5 MG PO TABS
12.5000 mg | ORAL_TABLET | Freq: Two times a day (BID) | ORAL | Status: DC
  Administered 2024-08-03 – 2024-08-05 (×4): 12.5 mg via ORAL
  Filled 2024-08-03 (×5): qty 1

## 2024-08-03 NOTE — Progress Notes (Signed)
 Progress Note   [name] FMW:[id number] DOB: 03/25/1986 DOA: 07/27/2024     7 DOS: the patient was seen and examined on 08/03/2024   Brief hospital course: Nathan Heath is a 38 yr old African American male with history of hypertension, CKD presented for shortness of breath and orthopnea, PND admitted to heart failure service for new onset systolic CHF started on IV diuretics with good urine output. His symptoms did improve. Nephrology consulted for renal failure and need for HD. TRH service asked to take over his care.  Assessment and Plan: Acute systolic CHF- Presented with shortness of breath, orthopnea, PND, BNP of 27,000, troponin 138. Echocardiogram reviewed shows EF 35 to 40%. Unable to perform heart cath due to his renal dysfunction. He may need cardiac amyloidosis workup, as mother is being worked up for suspicion of cardiac amyloidosis. Continue to monitor daily weights, strict input and output. Monitor daily electrolytes, renal function. CHF instructions, fluid restriction advised. Lasix  held 07/31/24 per nephrology. Heart failure team follow-up appreciated.  Acute worsening of CKD stage 3b- Cr 6.47 today little improved from yesterday. Nephrology work up in progress for kidney failure. No HD at this time. Kidney biopsy per IR once BP better controlled. BP still elevated. Continue to monitor urine output, daily renal function. Avoid nephrotoxic drugs including ACE inhibitors, ARB, SGLT2. Patient understands that he may need dialysis if kidney function does not improve.  Uncontrolled hypertension - Blood pressure around 160/110.  Continue hydralazine  100 q8, Isordil  40 q8, Norvasc  10 qd and increased Coreg  to 12.5 bid. IV hydralazine  as needed for elevated blood pressure.  Normocytic Anemia- Hb drop noted.  Today 8.5, baseline normal Hb (2023). Will get anemia panel. This could be anemia from kidney disease. He denies active bleeding, check  FOBT.  Hypokalemia- continue repletion as needed.  Hyponatremia- Due to hypervolemia Stable sodium at 132. Trend daily Na.      Out of bed to chair. Incentive spirometry. Nursing supportive care. Fall, aspiration precautions. Diet:  Diet Orders (From admission, onward)     Start     Ordered   08/04/24 0001  Diet NPO time specified  Diet effective midnight        08/01/24 1626   08/01/24 1604  Diet renal with fluid restriction Fluid restriction: 1200 mL Fluid; Room service appropriate? Yes; Fluid consistency: Thin  Diet effective now       Question Answer Comment  Fluid restriction: 1200 mL Fluid   Room service appropriate? Yes   Fluid consistency: Thin      08/01/24 1603           DVT prophylaxis: heparin  injection 5,000 Units Start: 07/27/24 2345  Level of care: Telemetry Cardiac   Code Status: Full Code  Subjective: Patient is seen and examined today morning. Lying in bed, states he did not go out as there is no help. Wishes to take rest today. BP better but still higher side.  Physical Exam: Vitals:   08/02/24 2043 08/03/24 0637 08/03/24 0729 08/03/24 0900  BP: (!) 146/111 (!) 161/112 (!) 149/112 (!) 139/104  Pulse: (!) 101 (!) 101 (!) 103 (!) 101  Resp: 19 20 20 18   Temp: 98.5 F (36.9 C) 99.1 F (37.3 C) 98.7 F (37.1 C)   TempSrc: Oral Oral Oral   SpO2: 100% 100% 100%   Weight:  98.2 kg    Height:        General - Young African-American male, mild respiratory distress HEENT - PERRLA, EOMI,  atraumatic head, non tender sinuses. Lung - Clear, basal rales, no rhonchi, wheezes. Heart - S1, S2 heard, no murmurs, rubs, trace pedal edema. Abdomen - Soft, non tender, bowel sounds good Neuro - Alert, awake and oriented x 3, non focal exam. Skin - Warm and dry.  Data Reviewed:      Latest Ref Rng & Units 08/03/2024    4:27 AM 08/02/2024    2:20 AM 08/01/2024    2:38 AM  CBC  WBC 4.0 - 10.5 K/uL 7.3  8.0  8.6   Hemoglobin 13.0 - 17.0 g/dL 8.5  8.6   8.8   Hematocrit 39.0 - 52.0 % 25.2  25.4  26.4   Platelets 150 - 400 K/uL 422  430  462       Latest Ref Rng & Units 08/03/2024    4:27 AM 08/02/2024    2:20 AM 08/01/2024    2:38 AM  BMP  Glucose 70 - 99 mg/dL 884  98  891   BUN 6 - 20 mg/dL 60  58  52   Creatinine 0.61 - 1.24 mg/dL 3.52  3.35  3.21   Sodium 135 - 145 mmol/L 132  131  132   Potassium 3.5 - 5.1 mmol/L 3.8  3.3  3.5   Chloride 98 - 111 mmol/L 97  97  98   CO2 22 - 32 mmol/L 22  21  21    Calcium 8.9 - 10.3 mg/dL 9.1  9.1  9.1    No results found.   Family Communication: Discussed with patient at bedside. He understand and agree. All questions answered.  Disposition: Status is: Inpatient Remains inpatient appropriate because: Severity of illness, kidney bx  Planned Discharge Destination: Home     Time spent: 47 minutes.  Author: Concepcion Riser, MD 08/03/2024 11:08 AM Secure chat 7am to 7pm For on call review [email or web address].

## 2024-08-03 NOTE — Progress Notes (Addendum)
 [facility] NEPHROLOGY PROGRESS NOTE  Assessment/ Plan: Pt is a 38 y.o. yo male  with a past medical history significant for Heath, Nathan Heath, Nathan stage IIIb who was presented with shortness of breath and orthopnea seen as a consultation for the evaluation of AKI on Nathan.   # Acute kidney injury on Nathan 3B versus progressive CKD5 in the setting of uncontrolled Heath and CHF.  The creatinine level was 2.22 in 05/2022 and then no follow-up labs until this hospitalization with creatinine level 6.5 and hypertensive emergency.  UA with proteinuria, no RBC. UPCR around 1 g.  Hep B, hep C, HIV, ANA negative, anti-GBM, ANCA, C3, C4 negative, k/l ratio 1.68.  Doppler US  no RAS, and echogenic kidneys in US  but normal sized kidneys.  - IR consulted for kidney biopsy.  Did not get biopsy on 9/12 for elevated BP per IR.  Hopefully he will get biopsy in next day or so as his BP is improving.  [facility] biopsy requisition form is in the chart. - Treated with IV diuretics with improvement of volume status, furosemide  held since 9/11. No signs or symptoms of uremia.   -No need for dialysis today however plan to evaluat daily for dialysis need.   -Continue to avoid ACE inhibitor, ARBor SGLT2I.   - Strict ins and outs and daily.   # Acute CHF exacerbation/orthopnea/acute mild pulmonary edema: Treated with IV diuretics, concern for amyloidosis.  Continue to hold diuretics because of AKI.   # Hypertensive emergency: BP suboptimal, therefore the medications were adjusted.  Continue amlodipine , carvedilol , hydralazine , Isordil .  We have adjusted his medication with improvement of BP.  # Anemia chronic illness/Nathan: Iron  saturation 7% with low hemoglobin.  Treated with iron , started erythropoietin.   # Hypokalemia: Continue to replete potassium chloride  and monitor lab.  # Hyponatremia: Reduced free water intake.  Monitor lab.  Discussed with the patient's mother and other family member  9/11.  Subjective: Seen and examined at bedside.  Urine output is around 2.2 L.  Denies nausea, vomiting, chest pain, shortness of breath.  Objective Vital signs in last 24 hours: Vitals:   08/02/24 2043 08/03/24 0637 08/03/24 0729 08/03/24 0900  BP: (!) 146/111 (!) 161/112 (!) 149/112 (!) 139/104  Pulse: (!) 101 (!) 101 (!) 103 (!) 101  Resp: 19 20 20 18   Temp: 98.5 F (36.9 C) 99.1 F (37.3 C) 98.7 F (37.1 C)   TempSrc: Oral Oral Oral   SpO2: 100% 100% 100%   Weight:  98.2 kg    Height:       Weight change: 0.3 kg  Intake/Output Summary (Last 24 hours) at 08/03/2024 1038 Last data filed at 08/03/2024 1000 Gross per 24 hour  Intake 600 ml  Output 2650 ml  Net -2050 ml        Labs: RENAL PANEL Recent Labs  Lab 07/27/24 1853 07/28/24 0349 07/29/24 0333 07/30/24 0250 07/31/24 0310 08/01/24 0238 08/02/24 0220 08/03/24 0427  NA  --  135 135 134* 133* 132* 131* 132*  K  --  2.8* 2.7* 3.1* 3.0* 3.5 3.3* 3.8  CL  --  97* 96* 97* 96* 98 97* 97*  CO2  --  24 21* 22 22 21* 21* 22  GLUCOSE  --  102* 110* 127* 111* 108* 98 115*  BUN  --  57* 50* 49* 51* 52* 58* 60*  CREATININE  --  6.51* 6.24* 6.71* 7.27* 6.78* 6.64* 6.47*  CALCIUM  --  8.8* 9.3 9.3 9.2  9.1 9.1 9.1  MG  --  2.1 2.0 2.1 2.1 2.1  --   --   ALBUMIN 3.9  --   --   --  3.3*  --   --   --     Liver Function Tests: Recent Labs  Lab 07/27/24 1853 07/31/24 0310  AST 20  --   ALT 30  --   ALKPHOS 101  --   BILITOT 0.4  --   PROT 7.3  --   ALBUMIN 3.9 3.3*   No results for input(s): LIPASE, AMYLASE in the last 168 hours. No results for input(s): AMMONIA in the last 168 hours. CBC: Recent Labs    07/28/24 0349 07/29/24 0333 07/30/24 0250 07/31/24 0310 08/01/24 0238 08/02/24 0220 08/03/24 0427  HGB 9.7*   < > 9.7* 9.0* 8.8* 8.6* 8.5*  MCV 87.1   < > 85.6 86.4 86.8 86.1 86.3  FERRITIN 205  --   --   --   --   --   --   TIBC 300  --   --   --   --   --   --   IRON  20*  --   --   --   --    --   --    < > = values in this interval not displayed.    Cardiac Enzymes: No results for input(s): CKTOTAL, CKMB, CKMBINDEX, TROPONINI in the last 168 hours. CBG: No results for input(s): GLUCAP in the last 168 hours.  Iron  Studies:  No results for input(s): IRON , TIBC, TRANSFERRIN, FERRITIN in the last 72 hours.  Studies/Results: No results found.    Medications: Infusions:   Scheduled Medications:  amLODipine   10 mg Oral Daily   carvedilol   12.5 mg Oral BID WC   darbepoetin (ARANESP ) injection - DIALYSIS  60 mcg Subcutaneous Q Thu-1800   heparin   5,000 Units Subcutaneous Q8H   hydrALAZINE   100 mg Oral Q8H   isosorbide  dinitrate  40 mg Oral Q8H   oxymetazoline   1 spray Each Nare BID   sodium chloride  flush  3 mL Intravenous Q12H    have reviewed scheduled and prn medications.  Physical Exam: General:NAD, comfortable Heart:RRR, s1s2 nl Lungs:clear b/l, no crackle Abdomen:soft, Non-tender, non-distended Extremities:No edema Neurology: Alert, awake and following commands  Jeania Nater Prasad Miloh Alcocer 08/03/2024,10:38 AM  LOS: 7 days

## 2024-08-03 NOTE — Plan of Care (Signed)
  Problem: Clinical Measurements: Goal: Will remain free from infection Outcome: Progressing Goal: Respiratory complications will improve Outcome: Progressing   Problem: Nutrition: Goal: Adequate nutrition will be maintained Outcome: Progressing   

## 2024-08-04 LAB — PROTIME-INR
INR: 1.1 (ref 0.8–1.2)
Prothrombin Time: 15.3 s — ABNORMAL HIGH (ref 11.4–15.2)

## 2024-08-04 LAB — BASIC METABOLIC PANEL WITH GFR
Anion gap: 13 (ref 5–15)
BUN: 60 mg/dL — ABNORMAL HIGH (ref 6–20)
CO2: 21 mmol/L — ABNORMAL LOW (ref 22–32)
Calcium: 8.9 mg/dL (ref 8.9–10.3)
Chloride: 97 mmol/L — ABNORMAL LOW (ref 98–111)
Creatinine, Ser: 6.43 mg/dL — ABNORMAL HIGH (ref 0.61–1.24)
GFR, Estimated: 11 mL/min — ABNORMAL LOW (ref >60)
Glucose, Bld: 119 mg/dL — ABNORMAL HIGH (ref 70–99)
Potassium: 3.4 mmol/L — ABNORMAL LOW (ref 3.5–5.1)
Sodium: 131 mmol/L — ABNORMAL LOW (ref 135–145)

## 2024-08-04 LAB — CBC
HCT: 25.1 % — ABNORMAL LOW (ref 39.0–52.0)
Hemoglobin: 8.4 g/dL — ABNORMAL LOW (ref 13.0–17.0)
MCH: 29.4 pg (ref 26.0–34.0)
MCHC: 33.5 g/dL (ref 30.0–36.0)
MCV: 87.8 fL (ref 80.0–100.0)
Platelets: 431 K/uL — ABNORMAL HIGH (ref 150–400)
RBC: 2.86 MIL/uL — ABNORMAL LOW (ref 4.22–5.81)
RDW: 13.1 % (ref 11.5–15.5)
WBC: 5.7 K/uL (ref 4.0–10.5)
nRBC: 0 % (ref 0.0–0.2)

## 2024-08-04 LAB — METANEPHRINES, URINE, 24 HOUR
Metaneph Total, Ur: 82 ug/L
Metanephrines, 24H Ur: 287 ug/(24.h) — ABNORMAL HIGH (ref 58–276)
Normetanephrine, 24H Ur: 2230 ug/(24.h) — ABNORMAL HIGH (ref 156–729)
Normetanephrine, Ur: 637 ug/L

## 2024-08-04 MED ORDER — FUROSEMIDE 10 MG/ML IJ SOLN
60.0000 mg | Freq: Once | INTRAMUSCULAR | Status: AC
  Administered 2024-08-04: 60 mg via INTRAVENOUS
  Filled 2024-08-04: qty 6

## 2024-08-04 MED ORDER — POTASSIUM CHLORIDE CRYS ER 10 MEQ PO TBCR
10.0000 meq | EXTENDED_RELEASE_TABLET | Freq: Once | ORAL | Status: AC
  Administered 2024-08-04: 10 meq via ORAL
  Filled 2024-08-04: qty 1

## 2024-08-04 MED ORDER — CLONIDINE HCL 0.1 MG PO TABS
0.1000 mg | ORAL_TABLET | Freq: Two times a day (BID) | ORAL | Status: DC
  Administered 2024-08-04 – 2024-08-07 (×7): 0.1 mg via ORAL
  Filled 2024-08-04 (×8): qty 1

## 2024-08-04 NOTE — Progress Notes (Signed)
 Progress Note   [name] FMW:[id number] DOB: 1986/10/08 DOA: 07/27/2024     8 DOS: the patient was seen and examined on 08/04/2024   Brief hospital course: Nathan Heath is a 38 yr old African American male with history of hypertension, CKD presented for shortness of breath and orthopnea, PND admitted to heart failure service for new onset systolic CHF started on IV diuretics with good urine output. His symptoms did improve. Nephrology consulted for renal failure and need for HD. TRH service asked to take over his care.  Assessment and Plan: Acute systolic CHF- Presented with shortness of breath, orthopnea, PND, BNP of 27,000, troponin 138. Echocardiogram reviewed shows EF 35 to 40%. Unable to perform heart cath due to his renal dysfunction. He may need cardiac amyloidosis workup, as mother is being worked up for suspicion of cardiac amyloidosis. Heart failure team  Continue to monitor daily weights, strict input and output. Monitor daily electrolytes, renal function. CHF instructions, fluid restriction advised. Lasix  as needed per nephrology.  Acute worsening of CKD stage 3b- Cr 6.47 today little improved from yesterday. Nephrology work up in progress for kidney failure. No HD at this time. Lasix  one time ordered. Kidney biopsy per IR once BP better controlled.  Continue to monitor urine output, daily renal function. Avoid nephrotoxic drugs including ACE inhibitors, ARB, SGLT2. Patient understands that he may need dialysis if kidney function does not improve.  Uncontrolled hypertension - Blood pressure around 160/110.  Continue hydralazine  100 q8, Isordil  40 q8, Norvasc  10 every day, Coreg  to 12.5 bid. Started Clonidine  0.1 bid per nephrology. IV hydralazine  as needed for elevated blood pressure.  Normocytic Anemia- Hb drop noted.  Today 8.4, baseline normal Hb (2023). 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 He denies active bleeding, check  FOBT.  Hypokalemia- continue repletion as needed.  Hyponatremia- Due to hypervolemia Stable sodium at 132. Trend daily Na.      Out of bed to chair. Incentive spirometry. Nursing supportive care. Fall, aspiration precautions. Diet:  Diet Orders (From admission, onward)     Start     Ordered   08/05/24 0001  Diet NPO time specified  Diet effective midnight        08/04/24 1020   08/04/24 1021  Diet renal with fluid restriction Fluid restriction: 1200 mL Fluid; Room service appropriate? Yes; Fluid consistency: Thin  Diet effective now       Question Answer Comment  Fluid restriction: 1200 mL Fluid   Room service appropriate? Yes   Fluid consistency: Thin      08/04/24 1020           DVT prophylaxis: heparin  injection 5,000 Units Start: 07/27/24 2345  Level of care: Telemetry Cardiac   Code Status: Full Code  Subjective: Patient is seen and examined today morning. Resting comfortably. No chest pain. BP better but still higher side.  Physical Exam: Vitals:   08/04/24 0145 08/04/24 0500 08/04/24 0502 08/04/24 0719  BP: (!) 155/117  (!) 167/119 (!) 147/99  Pulse: (!) 106 100  (!) 101  Resp: 17 16  20   Temp: 99 F (37.2 C) 99.2 F (37.3 C)  99 F (37.2 C)  TempSrc: Oral Oral  Oral  SpO2: 100% 100%  100%  Weight:  98.4 kg    Height:        General - Young African-American male, mild respiratory distress HEENT - PERRLA, EOMI, atraumatic head, non tender sinuses. Lung - Clear, basal rales, no rhonchi, wheezes.  Heart - S1, S2 heard, no murmurs, rubs, trace pedal edema. Abdomen - Soft, non tender, bowel sounds good Neuro - Alert, awake and oriented x 3, non focal exam. Skin - Warm and dry.  Data Reviewed:      Latest Ref Rng & Units 08/04/2024    2:33 AM 08/03/2024    4:27 AM 08/02/2024    2:20 AM  CBC  WBC 4.0 - 10.5 K/uL 5.7  7.3  8.0   Hemoglobin 13.0 - 17.0 g/dL 8.4  8.5  8.6   Hematocrit 39.0 - 52.0 % 25.1  25.2  25.4   Platelets 150 - 400 K/uL 431   422  430       Latest Ref Rng & Units 08/04/2024    2:33 AM 08/03/2024    4:27 AM 08/02/2024    2:20 AM  BMP  Glucose 70 - 99 mg/dL 880  884  98   BUN 6 - 20 mg/dL 60  60  58   Creatinine 0.61 - 1.24 mg/dL 3.56  3.52  3.35   Sodium 135 - 145 mmol/L 131  132  131   Potassium 3.5 - 5.1 mmol/L 3.4  3.8  3.3   Chloride 98 - 111 mmol/L 97  97  97   CO2 22 - 32 mmol/L 21  22  21    Calcium 8.9 - 10.3 mg/dL 8.9  9.1  9.1    No results found.   Family Communication: Discussed with patient at bedside. He understand and agree. All questions answered.  Disposition: Status is: Inpatient Remains inpatient appropriate because: Severity of illness, kidney bx  Planned Discharge Destination: Home     Time spent: 45 minutes.  Author: Concepcion Riser, MD 08/04/2024 2:17 PM Secure chat 7am to 7pm For on call review [email or web address].

## 2024-08-04 NOTE — Plan of Care (Signed)
   Problem: Safety: Goal: Ability to remain free from injury will improve Outcome: Progressing

## 2024-08-04 NOTE — Progress Notes (Signed)
 [facility] NEPHROLOGY PROGRESS NOTE  Assessment/ Plan: Pt is a 38 y.o. yo male  with a past medical history significant for hypertension, dyslipidemia, CKD stage IIIb who was presented with shortness of breath and orthopnea seen as a consultation for the evaluation of AKI on CKD.   # Acute kidney injury on CKD 3B versus progressive CKD5 in the setting of uncontrolled hypertension and CHF.  The creatinine level was 2.22 in 05/2022 and then no follow-up labs until this hospitalization with creatinine level 6.5 and hypertensive emergency.  UA with proteinuria, no RBC. UPCR around 1 g.  Hep B, hep C, HIV, ANA negative, anti-GBM, ANCA, C3, C4 negative, k/l ratio 1.68.  Doppler US  no RAS, and echogenic kidneys in US  but normal sized kidneys.  ------------------------ - IR consulted for kidney biopsy.  [facility] biopsy requisition form is in the chart. - Further optimizing BP  - Treated with IV diuretics with improvement of volume status, furosemide  held since the 9/10 dose. No signs or symptoms of uremia.   - lasix  once today to optimize BP -No need for dialysis today however assess needs daily.  If he doesn't require dialysis this admission he is quite close  -Continue to avoid ACE inhibitors, ARBs or SGLT2I.   - Strict ins and outs and daily weights are ordered - check post-void residual bladder scan as I do not see one documented recently   # Acute CHF exacerbation/orthopnea/acute mild pulmonary edema:  - Treated with IV diuretics, concern for amyloidosis.   - lasix  as below - assess volume status and diuretic needs daily for now.  No evidence of overload     # Hypertensive emergency:  - Start clonidine  0.1 mg BID to further optimize BP - lasix  60 mg IV once  - Continue other current regimen  - next step is further increasing coreg  - which was just increased to 12.5 mg BID.   # Anemia chronic illness/CKD: Iron  saturation 7% with low hemoglobin.   - s/p venofer  - on aranesp  60  mcg every Thursday   # Hypokalemia:  - Continue to replete potassium chloride  and monitor labs daily.  (Defer repletion 9/15)  # Hyponatremia:  - secondary in part to reduced free water excretion with AKI  - Reduced free water intake.  - Trend electrolytes daily   Disposition - continue inpatient monitoring.  If he doesn't start dialysis he may be able to go home 1-2 days after renal biopsy if BP acceptable      Subjective:  His renal biopsy was pushed to 9/15 due to his blood pressure and IR volumes.  He had 1.4 liters UOP over 9/14.  Still with HTN.  I called IR and recommended to postpone the biopsy; the patient agrees, as well.  He states he's watched a video of the biopsy so that he knows what to expect and has no other questions   Review of systems:  - Denies shortness of breath or chest pain - Denies n/v; food doesn't taste normal but states that's because it's hospital food - No mental cloudiness that he's noticed   Objective Vital signs in last 24 hours: Vitals:   08/04/24 0145 08/04/24 0500 08/04/24 0502 08/04/24 0719  BP: (!) 155/117  (!) 167/119 (!) 147/99  Pulse: (!) 106 100  (!) 101  Resp: 17 16  20   Temp: 99 F (37.2 C) 99.2 F (37.3 C)  99 F (37.2 C)  TempSrc: Oral Oral  Oral  SpO2: 100% 100%  100%  Weight:  98.4 kg    Height:       Weight change: 0.2 kg  Intake/Output Summary (Last 24 hours) at 08/04/2024 0951 Last data filed at 08/04/2024 0149 Gross per 24 hour  Intake 0 ml  Output 950 ml  Net -950 ml        Labs: RENAL PANEL Recent Labs  Lab 07/29/24 0333 07/30/24 0250 07/31/24 0310 08/01/24 0238 08/02/24 0220 08/03/24 0427 08/04/24 0233  NA 135 134* 133* 132* 131* 132* 131*  K 2.7* 3.1* 3.0* 3.5 3.3* 3.8 3.4*  CL 96* 97* 96* 98 97* 97* 97*  CO2 21* 22 22 21* 21* 22 21*  GLUCOSE 110* 127* 111* 108* 98 115* 119*  BUN 50* 49* 51* 52* 58* 60* 60*  CREATININE 6.24* 6.71* 7.27* 6.78* 6.64* 6.47* 6.43*  CALCIUM 9.3 9.3 9.2 9.1 9.1 9.1  8.9  MG 2.0 2.1 2.1 2.1  --   --   --   ALBUMIN  --   --  3.3*  --   --   --   --     Liver Function Tests: Recent Labs  Lab 07/31/24 0310  ALBUMIN 3.3*   No results for input(s): LIPASE, AMYLASE in the last 168 hours. No results for input(s): AMMONIA in the last 168 hours. CBC: Recent Labs    07/28/24 0349 07/29/24 0333 07/31/24 0310 08/01/24 0238 08/02/24 0220 08/03/24 0422 08/03/24 0427 08/03/24 1230 08/04/24 0233  HGB 9.7*   < > 9.0* 8.8* 8.6*  --  8.5*  --  8.4*  MCV 87.1   < > 86.4 86.8 86.1  --  86.3  --  87.8  VITAMINB12  --   --   --   --   --   --   --  672  --   FOLATE  --   --   --   --   --  9.8  --   --   --   FERRITIN 205  --   --   --   --   --   --  476*  --   TIBC 300  --   --   --   --   --   --  315  --   IRON  20*  --   --   --   --   --   --  39*  --   RETICCTPCT  --   --   --   --   --  1.6  --   --   --    < > = values in this interval not displayed.    Cardiac Enzymes: No results for input(s): CKTOTAL, CKMB, CKMBINDEX, TROPONINI in the last 168 hours. CBG: No results for input(s): GLUCAP in the last 168 hours.  Iron  Studies:  Recent Labs    08/03/24 1230  IRON  39*  TIBC 315  FERRITIN 476*    Studies/Results: No results found.    Medications: Infusions:   Scheduled Medications:  amLODipine   10 mg Oral Daily   carvedilol   12.5 mg Oral BID WC   darbepoetin (ARANESP ) injection - DIALYSIS  60 mcg Subcutaneous Q Thu-1800   heparin   5,000 Units Subcutaneous Q8H   hydrALAZINE   100 mg Oral Q8H   isosorbide  dinitrate  40 mg Oral Q8H   oxymetazoline   1 spray Each Nare BID   sodium chloride  flush  3 mL Intravenous Q12H    have reviewed scheduled and prn medications.  Physical Exam:   General adult male in bed in no acute distress HEENT normocephalic atraumatic extraocular movements intact sclera anicteric Neck supple trachea midline Lungs clear to auscultation bilaterally normal work of breathing at rest on room  air Heart S1S2 no rub Abdomen soft nontender nondistended Extremities no edema  Psych normal mood and affect Neuro - alert and oriented x 3 provides hx and follows commands    Katheryn BROCKS Ameila Weldon 08/04/2024,10:27 AM  LOS: 8 days

## 2024-08-04 NOTE — Progress Notes (Signed)
 Mobility Specialist Progress Note:    08/04/24 1513  Mobility  Activity Ambulated independently  Level of Assistance Standby assist, set-up cues, supervision of patient - no hands on  Assistive Device None  Distance Ambulated (ft) 100 ft  Activity Response Tolerated well  Mobility Referral Yes  Mobility visit 1 Mobility  Mobility Specialist Start Time (ACUTE ONLY) 1513  Mobility Specialist Stop Time (ACUTE ONLY) 1529  Mobility Specialist Time Calculation (min) (ACUTE ONLY) 16 min   Received pt ambulating in room agreeable to session. Pt stated they're feeling a lot better mentally and emotionally. Pt moving and ambulating a lot better. Left pt in room w/ all needs met. Asked for a second session later today.  Venetia Keel Mobility Specialist Please Neurosurgeon or Rehab Office at [phone]

## 2024-08-05 ENCOUNTER — Telehealth (HOSPITAL_COMMUNITY): Payer: Self-pay

## 2024-08-05 LAB — BASIC METABOLIC PANEL WITH GFR
Anion gap: 14 (ref 5–15)
BUN: 64 mg/dL — ABNORMAL HIGH (ref 6–20)
CO2: 21 mmol/L — ABNORMAL LOW (ref 22–32)
Calcium: 8.9 mg/dL (ref 8.9–10.3)
Chloride: 95 mmol/L — ABNORMAL LOW (ref 98–111)
Creatinine, Ser: 6.43 mg/dL — ABNORMAL HIGH (ref 0.61–1.24)
GFR, Estimated: 11 mL/min — ABNORMAL LOW (ref >60)
Glucose, Bld: 99 mg/dL (ref 70–99)
Potassium: 3.6 mmol/L (ref 3.5–5.1)
Sodium: 130 mmol/L — ABNORMAL LOW (ref 135–145)

## 2024-08-05 LAB — CBC
HCT: 24.7 % — ABNORMAL LOW (ref 39.0–52.0)
Hemoglobin: 8.2 g/dL — ABNORMAL LOW (ref 13.0–17.0)
MCH: 28.9 pg (ref 26.0–34.0)
MCHC: 33.2 g/dL (ref 30.0–36.0)
MCV: 87 fL (ref 80.0–100.0)
Platelets: 416 K/uL — ABNORMAL HIGH (ref 150–400)
RBC: 2.84 MIL/uL — ABNORMAL LOW (ref 4.22–5.81)
RDW: 13.2 % (ref 11.5–15.5)
WBC: 5.4 K/uL (ref 4.0–10.5)
nRBC: 0 % (ref 0.0–0.2)

## 2024-08-05 MED ORDER — CARVEDILOL 25 MG PO TABS
25.0000 mg | ORAL_TABLET | Freq: Two times a day (BID) | ORAL | Status: DC
  Administered 2024-08-05: 25 mg via ORAL
  Filled 2024-08-05: qty 1

## 2024-08-05 MED ORDER — CARVEDILOL 12.5 MG PO TABS
12.5000 mg | ORAL_TABLET | Freq: Two times a day (BID) | ORAL | Status: DC
  Administered 2024-08-06 – 2024-08-07 (×3): 12.5 mg via ORAL
  Filled 2024-08-05 (×5): qty 1

## 2024-08-05 NOTE — Telephone Encounter (Signed)
 Called to confirm/remind patient of their appointment [facility] on 08/06/24.   Appointment:   [] Confirmed  [] Left mess   [x] No answer/No voice mail  [] VM Full/unable to leave message  [] Phone not in service

## 2024-08-05 NOTE — Progress Notes (Signed)
 Mobility Specialist Progress Note:    08/05/24 1712  Mobility  Activity Ambulated independently  Level of Assistance Independent  Assistive Device None  Distance Ambulated (ft) 500 ft  Activity Response Tolerated well  Mobility Referral Yes  Mobility visit 1 Mobility  Mobility Specialist Start Time (ACUTE ONLY) 1712  Mobility Specialist Stop Time (ACUTE ONLY) 1805  Mobility Specialist Time Calculation (min) (ACUTE ONLY) 53 min   Pt pleasant and agreeable to session. Seeing an overall improvement in physical, mental and emotion aspect of pt. Able to walk off unit and sit outside with MS w/ no issues. Returned pt to room w/ all needs met.   Nathan Heath Mobility Specialist Please Neurosurgeon or Rehab Office at [phone]

## 2024-08-05 NOTE — Progress Notes (Signed)
 Progress Note   [name] FMW:[id number] DOB: 08-30-1986 DOA: 07/27/2024     9 DOS: the patient was seen and examined on 08/05/2024   Brief hospital course: Nathan Heath is a 38 yr old African American male with history of hypertension, CKD presented for shortness of breath and orthopnea, PND admitted to heart failure service for new onset systolic CHF started on IV diuretics with good urine output. His symptoms did improve. Nephrology consulted for renal failure and need for HD. TRH service asked to take over his care.  Assessment and Plan: Acute systolic CHF- Presented with shortness of breath, orthopnea, PND, BNP of 27,000, troponin 138. Echocardiogram reviewed shows EF 35 to 40%. Unable to perform heart cath due to his renal dysfunction. Concern for amyloidosis as mother being worked up. Continue to monitor daily weights, strict input and output. Monitor daily electrolytes, renal function. Avoid ACE inhibitors, ARB, SGLT2. CHF instructions, fluid restriction advised. Lasix  as needed per nephrology.  Acute worsening of CKD stage 3b- Cr 6.43 today, Nephrology work up in progress for kidney failure. No HD at this time. Lasix  as needed per nephro team. Kidney biopsy per IR tomorrow as BP better controlled.  Continue to monitor urine output, daily renal function. Avoid nephrotoxic drugs including ACE inhibitors, ARB, SGLT2. Patient understands that he may need dialysis if kidney function does not improve.  Uncontrolled hypertension - Blood pressure improved.  Continue hydralazine  100 q8, Isordil  40 q8, Norvasc  10 every day, Coreg  increased to 25 bid. And on Clonidine  0.1 bid per nephrology. IV hydralazine  as needed for elevated blood pressure.  Normocytic Anemia- Hb around 8, baseline normal Hb (2023). 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 He denies active bleeding, check FOBT. Iron  supplements as outpatient.  Hypokalemia- continue repletion as  needed.  Hyponatremia- Due to hypervolemia Stable sodium at 130. Trend daily Na.      Out of bed to chair. Incentive spirometry. Nursing supportive care. Fall, aspiration precautions. Diet:  Diet Orders (From admission, onward)     Start     Ordered   08/06/24 0001  Diet NPO time specified  Diet effective midnight        08/05/24 0847   08/05/24 0848  Diet renal with fluid restriction Fluid restriction: 1200 mL Fluid; Room service appropriate? Yes; Fluid consistency: Thin  Diet effective now       Question Answer Comment  Fluid restriction: 1200 mL Fluid   Room service appropriate? Yes   Fluid consistency: Thin      08/05/24 0847           DVT prophylaxis: heparin  injection 5,000 Units Start: 07/27/24 2345  Level of care: Telemetry Cardiac   Code Status: Full Code  Subjective: Patient is seen and examined today morning. Resting comfortably. No chest pain. Did walk yesterday. No other complaints.  Physical Exam: Vitals:   08/05/24 0114 08/05/24 0526 08/05/24 0816 08/05/24 1134  BP: (!) 131/95 (!) 147/111 125/85 120/85  Pulse: 97 96 88 85  Resp: 19 18 18 20   Temp: 99.4 F (37.4 C) 99 F (37.2 C) 99 F (37.2 C) 98 F (36.7 C)  TempSrc: Oral Oral Oral Oral  SpO2: 99% 98% 95% 99%  Weight:  97.1 kg    Height:        General - Young African-American male, no respiratory distress HEENT - PERRLA, EOMI, atraumatic head, non tender sinuses. Lung - Clear, basal rales, no rhonchi, wheezes. Heart - S1, S2 heard, no murmurs,  rubs, trace pedal edema. Abdomen - Soft, non tender, bowel sounds good Neuro - Alert, awake and oriented x 3, non focal exam. Skin - Warm and dry.  Data Reviewed:      Latest Ref Rng & Units 08/05/2024    4:18 AM 08/04/2024    2:33 AM 08/03/2024    4:27 AM  CBC  WBC 4.0 - 10.5 K/uL 5.4  5.7  7.3   Hemoglobin 13.0 - 17.0 g/dL 8.2  8.4  8.5   Hematocrit 39.0 - 52.0 % 24.7  25.1  25.2   Platelets 150 - 400 K/uL 416  431  422       Latest  Ref Rng & Units 08/05/2024    4:18 AM 08/04/2024    2:33 AM 08/03/2024    4:27 AM  BMP  Glucose 70 - 99 mg/dL 99  880  884   BUN 6 - 20 mg/dL 64  60  60   Creatinine 0.61 - 1.24 mg/dL 3.56  3.56  3.52   Sodium 135 - 145 mmol/L 130  131  132   Potassium 3.5 - 5.1 mmol/L 3.6  3.4  3.8   Chloride 98 - 111 mmol/L 95  97  97   CO2 22 - 32 mmol/L 21  21  22    Calcium 8.9 - 10.3 mg/dL 8.9  8.9  9.1    No results found.   Family Communication: Discussed with patient at bedside. He understand and agree. All questions answered.  Disposition: Status is: Inpatient Remains inpatient appropriate because: Severity of illness, kidney bx  Planned Discharge Destination: Home     Time spent: 46 minutes.  Author: Concepcion Riser, MD 08/05/2024 3:49 PM Secure chat 7am to 7pm For on call review [email or web address].

## 2024-08-05 NOTE — Progress Notes (Signed)
 [facility] NEPHROLOGY PROGRESS NOTE  Assessment/ Plan: Pt is a 38 y.o. yo male  with a past medical history significant for hypertension, dyslipidemia, CKD stage IIIb who was presented with shortness of breath and orthopnea seen as a consultation for the evaluation of AKI on CKD.   # Acute kidney injury on CKD 3B versus progressive CKD5 in the setting of uncontrolled hypertension and CHF.  The creatinine level was 2.22 in 05/2022 and then no follow-up labs until this hospitalization with creatinine level 6.5 and hypertensive emergency.  UA with proteinuria, no RBC. UPCR around 1 g.  Hep B, hep C, HIV, ANA negative, anti-GBM, ANCA, C3, C4 negative, k/l ratio 1.68.  SPEP no M spike.  Doppler US  no RAS, and echogenic kidneys in US  but normal sized kidneys.  ------------------------ - IR consulted for kidney biopsy.  I spoke with IR PA's and they are rescheduling the biopsy for tomorrow.  [facility] biopsy requisition form is in the chart and I have updated this.  Would also need to stain for amyloid  - Further optimizing BP which has improved quite a bit - Treated with IV diuretics with improvement of volume status then held.  Lasix  redosed on 9/15.  No signs or symptoms of uremia.   -No need for dialysis today however assess needs daily.  If he doesn't require dialysis this admission he is quite close  -Continue to avoid ACE inhibitors, ARBs or SGLT2I.   - Strict ins and outs and daily weights are ordered - Repeat post-void residual bladder scan    # Acute CHF exacerbation/orthopnea/acute mild pulmonary edema:  - Treated with IV diuretics, concern for amyloidosis.   - lasix  as below - assess volume status and diuretic needs daily for now.  No evidence of overload     # Hypertensive emergency:  - Started clonidine  0.1 mg BID on 9/15 to further optimize BP - defer lasix  today - Continue other current regimen  - Increase carvedilol  to 25 mg BID   # Anemia chronic illness/CKD: Iron   saturation 7% with low hemoglobin.   - s/p venofer  - on aranesp  60 mcg every Thursday   # Hypokalemia:  - acceptable   # Hyponatremia:  - secondary in part to reduced free water excretion with AKI  - Trend electrolytes daily   Disposition - continue inpatient monitoring.  If he doesn't start dialysis he may be able to go home the day after renal biopsy if labs are stable.  He will need close follow-up with our office.     Subjective:  Biopsy was deferred on 9/15 due to hypertension.  He had 1.6 liters UOP over 9/15.  Still with HTN but better.  Post void residual bladder scan charted as elevated at 110.  All questions answered.  We discussed future goal of transplant for him should his renal function persist at this level and discussed types of dialysis including dialysis at home.     Review of systems:    - Denies shortness of breath or chest pain - Denies n/v; food doesn't taste normal but states that's because it's hospital food - No mental cloudiness that he's noticed   Objective Vital signs in last 24 hours: Vitals:   08/04/24 1546 08/04/24 1946 08/05/24 0114 08/05/24 0526  BP: (!) 146/105 (!) 122/93 (!) 131/95 (!) 147/111  Pulse:  87 97 96  Resp:  18 19 18   Temp:  98.3 F (36.8 C) 99.4 F (37.4 C) 99 F (37.2 C)  TempSrc:  Oral Oral Oral  SpO2:  99% 99% 98%  Weight:    97.1 kg  Height:       Weight change: -1.33 kg  Intake/Output Summary (Last 24 hours) at 08/05/2024 0751 Last data filed at 08/05/2024 0529 Gross per 24 hour  Intake --  Output 1600 ml  Net -1600 ml        Labs: RENAL PANEL Recent Labs  Lab 07/30/24 0250 07/31/24 0310 08/01/24 0238 08/02/24 0220 08/03/24 0427 08/04/24 0233 08/05/24 0418  NA 134* 133* 132* 131* 132* 131* 130*  K 3.1* 3.0* 3.5 3.3* 3.8 3.4* 3.6  CL 97* 96* 98 97* 97* 97* 95*  CO2 22 22 21* 21* 22 21* 21*  GLUCOSE 127* 111* 108* 98 115* 119* 99  BUN 49* 51* 52* 58* 60* 60* 64*  CREATININE 6.71* 7.27* 6.78* 6.64*  6.47* 6.43* 6.43*  CALCIUM 9.3 9.2 9.1 9.1 9.1 8.9 8.9  MG 2.1 2.1 2.1  --   --   --   --   ALBUMIN  --  3.3*  --   --   --   --   --     Liver Function Tests: Recent Labs  Lab 07/31/24 0310  ALBUMIN 3.3*   No results for input(s): LIPASE, AMYLASE in the last 168 hours. No results for input(s): AMMONIA in the last 168 hours. CBC: Recent Labs    07/28/24 0349 07/29/24 0333 08/01/24 0238 08/02/24 0220 08/03/24 0422 08/03/24 0427 08/03/24 1230 08/04/24 0233 08/05/24 0418  HGB 9.7*   < > 8.8* 8.6*  --  8.5*  --  8.4* 8.2*  MCV 87.1   < > 86.8 86.1  --  86.3  --  87.8 87.0  VITAMINB12  --   --   --   --   --   --  672  --   --   FOLATE  --   --   --   --  9.8  --   --   --   --   FERRITIN 205  --   --   --   --   --  476*  --   --   TIBC 300  --   --   --   --   --  315  --   --   IRON  20*  --   --   --   --   --  39*  --   --   RETICCTPCT  --   --   --   --  1.6  --   --   --   --    < > = values in this interval not displayed.    Cardiac Enzymes: No results for input(s): CKTOTAL, CKMB, CKMBINDEX, TROPONINI in the last 168 hours. CBG: No results for input(s): GLUCAP in the last 168 hours.  Iron  Studies:  Recent Labs    08/03/24 1230  IRON  39*  TIBC 315  FERRITIN 476*    Studies/Results: No results found.    Medications: Infusions:   Scheduled Medications:  amLODipine   10 mg Oral Daily   carvedilol   12.5 mg Oral BID WC   cloNIDine   0.1 mg Oral BID   darbepoetin (ARANESP ) injection - DIALYSIS  60 mcg Subcutaneous Q Thu-1800   heparin   5,000 Units Subcutaneous Q8H   hydrALAZINE   100 mg Oral Q8H   isosorbide  dinitrate  40 mg Oral Q8H   sodium chloride  flush  3 mL Intravenous  Q12H    have reviewed scheduled and prn medications.  Physical Exam:     General adult male in bed in no acute distress HEENT normocephalic atraumatic extraocular movements intact sclera anicteric Neck supple trachea midline Lungs clear to auscultation bilaterally  normal work of breathing at rest on room air Heart S1S2 no rub Abdomen soft nontender nondistended Extremities no edema  Psych normal mood and affect Neuro - alert and oriented x 3 provides hx and follows commands    Katheryn BROCKS Latisia Hilaire 08/05/2024,8:48 AM  LOS: 9 days

## 2024-08-06 ENCOUNTER — Inpatient Hospital Stay (HOSPITAL_COMMUNITY): Admit: 2024-08-06 | Payer: Self-pay

## 2024-08-06 ENCOUNTER — Inpatient Hospital Stay (HOSPITAL_COMMUNITY): Payer: MEDICAID

## 2024-08-06 LAB — BASIC METABOLIC PANEL WITH GFR
Anion gap: 12 (ref 5–15)
BUN: 68 mg/dL — ABNORMAL HIGH (ref 6–20)
CO2: 22 mmol/L (ref 22–32)
Calcium: 8.8 mg/dL — ABNORMAL LOW (ref 8.9–10.3)
Chloride: 97 mmol/L — ABNORMAL LOW (ref 98–111)
Creatinine, Ser: 6.98 mg/dL — ABNORMAL HIGH (ref 0.61–1.24)
GFR, Estimated: 10 mL/min — ABNORMAL LOW (ref >60)
Glucose, Bld: 114 mg/dL — ABNORMAL HIGH (ref 70–99)
Potassium: 3.6 mmol/L (ref 3.5–5.1)
Sodium: 131 mmol/L — ABNORMAL LOW (ref 135–145)

## 2024-08-06 LAB — CBC
HCT: 25.8 % — ABNORMAL LOW (ref 39.0–52.0)
Hemoglobin: 8.5 g/dL — ABNORMAL LOW (ref 13.0–17.0)
MCH: 28.8 pg (ref 26.0–34.0)
MCHC: 32.9 g/dL (ref 30.0–36.0)
MCV: 87.5 fL (ref 80.0–100.0)
Platelets: 431 K/uL — ABNORMAL HIGH (ref 150–400)
RBC: 2.95 MIL/uL — ABNORMAL LOW (ref 4.22–5.81)
RDW: 13.2 % (ref 11.5–15.5)
WBC: 5.8 K/uL (ref 4.0–10.5)
nRBC: 0 % (ref 0.0–0.2)

## 2024-08-06 MED ORDER — FENTANYL CITRATE (PF) 100 MCG/2ML IJ SOLN
INTRAMUSCULAR | Status: AC
  Filled 2024-08-06: qty 2

## 2024-08-06 MED ORDER — MIDAZOLAM HCL 2 MG/2ML IJ SOLN
INTRAMUSCULAR | Status: AC
  Filled 2024-08-06: qty 2

## 2024-08-06 MED ORDER — MIDAZOLAM HCL 2 MG/2ML IJ SOLN
INTRAMUSCULAR | Status: DC | PRN
  Administered 2024-08-06: 1 mg via INTRAVENOUS
  Administered 2024-08-06 (×2): .5 mg via INTRAVENOUS

## 2024-08-06 MED ORDER — FENTANYL CITRATE (PF) 100 MCG/2ML IJ SOLN
INTRAMUSCULAR | Status: DC | PRN
  Administered 2024-08-06: 25 ug via INTRAVENOUS
  Administered 2024-08-06: 50 ug via INTRAVENOUS

## 2024-08-06 MED ORDER — GELATIN ABSORBABLE 12-7 MM EX MISC
1.0000 | Freq: Once | CUTANEOUS | Status: AC
  Administered 2024-08-06: 1 via TOPICAL

## 2024-08-06 MED ORDER — FERROUS SULFATE 325 (65 FE) MG PO TABS
325.0000 mg | ORAL_TABLET | Freq: Two times a day (BID) | ORAL | Status: DC
  Administered 2024-08-06 – 2024-08-07 (×2): 325 mg via ORAL
  Filled 2024-08-06 (×2): qty 1

## 2024-08-06 MED ORDER — LIDOCAINE HCL (PF) 1 % IJ SOLN
17.0000 mL | Freq: Once | INTRAMUSCULAR | Status: AC
Start: 2024-08-06 — End: 2024-08-06
  Administered 2024-08-06: 17 mL via INTRADERMAL

## 2024-08-06 NOTE — Progress Notes (Signed)
 Mobility Specialist Progress Note:    08/06/24 1650  Mobility  Activity Ambulated independently  Level of Assistance Independent after set-up  Assistive Device None  Distance Ambulated (ft) 200 ft  Activity Response Tolerated well  Mobility Referral Yes  Mobility visit 1 Mobility  Mobility Specialist Start Time (ACUTE ONLY) 1650  Mobility Specialist Stop Time (ACUTE ONLY) 1709  Mobility Specialist Time Calculation (min) (ACUTE ONLY) 19 min   Pt pleasant in room with family agreeable to short session. No c/o any symptoms. Pt feeling slightly fatigued d/t meds but is otherwise fine. Returned pt to room w/ all needs met and father in room.   Venetia Keel Mobility Specialist Please Neurosurgeon or Rehab Office at [phone]

## 2024-08-06 NOTE — Plan of Care (Signed)
  Problem: Education: Goal: Knowledge of General Education information will improve Description: Including pain rating scale, medication(s)/side effects and non-pharmacologic comfort measures Outcome: Progressing   Problem: Health Behavior/Discharge Planning: Goal: Ability to manage health-related needs will improve Outcome: Progressing   Problem: Clinical Measurements: Goal: Ability to maintain clinical measurements within normal limits will improve Outcome: Progressing Goal: Will remain free from infection Outcome: Progressing Goal: Diagnostic test results will improve Outcome: Progressing Goal: Respiratory complications will improve Outcome: Progressing Goal: Cardiovascular complication will be avoided Outcome: Progressing   Problem: Nutrition: Goal: Adequate nutrition will be maintained Outcome: Progressing   Problem: Coping: Goal: Level of anxiety will decrease 08/06/2024 0515 by Jennine Benton HERO, RN Outcome: Progressing 08/06/2024 0515 by Jennine Benton HERO, RN Outcome: Not Progressing   Problem: Elimination: Goal: Will not experience complications related to bowel motility Outcome: Progressing Goal: Will not experience complications related to urinary retention Outcome: Progressing   Problem: Pain Managment: Goal: General experience of comfort will improve and/or be controlled Outcome: Progressing   Problem: Safety: Goal: Ability to remain free from injury will improve Outcome: Progressing

## 2024-08-06 NOTE — Progress Notes (Signed)
 Progress Note   [name] FMW:[id number] DOB: 12/02/1985 DOA: 07/27/2024     10 DOS: the patient was seen and examined on 08/06/2024   Brief hospital course: Paige Counterman is a 38 yr old African American male with history of hypertension, CKD presented for shortness of breath and orthopnea, PND admitted to heart failure service for new onset systolic CHF started on IV diuretics with good urine output. His symptoms did improve. Nephrology consulted for renal failure and need for HD. TRH service asked to take over his care.  Assessment and Plan: Acute systolic CHF- Presented with shortness of breath, orthopnea, PND, BNP of 27,000, troponin 138. Echocardiogram reviewed shows EF 35 to 40%. Unable to perform heart cath due to his renal dysfunction. Concern for amyloidosis as mother being worked up. Continue to monitor daily weights, strict input and output. Avoid ACE inhibitors, ARB, SGLT2. CHF instructions, fluid restriction advised. Lasix  as needed per nephrology.  Acute worsening of CKD stage 3b- Cr 6.43 today, Nephrology work up in progress for kidney failure. No HD at this time. Lasix  as needed per nephro team. Kidney biopsy done today by IR. Continue to monitor urine output, PVC, daily renal function. Avoid nephrotoxic drugs including ACE inhibitors, ARB, SGLT2. Patient understands that he may need dialysis if kidney function does not improve.  Uncontrolled hypertension - Blood pressure much improved.  Continue hydralazine  100 q8, Isordil  40 q8, Norvasc  10 every day, Coreg  increased to 12.5 bid, Clonidine  0.1 bid. IV hydralazine  as needed for elevated blood pressure.  Normocytic Anemia- Hb around 8.5, baseline normal Hb (2023). 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 He denies active bleeding, check FOBT. Iron  supplements oral bid ordered.  Hypokalemia- continue repletion as needed.  Hyponatremia- Due to hypervolemia Stable sodium at  131. Trend daily Na.      Out of bed to chair. Incentive spirometry. Nursing supportive care. Fall, aspiration precautions. Diet:  Diet Orders (From admission, onward)     Start     Ordered   08/06/24 1227  Diet renal with fluid restriction Fluid restriction: 1200 mL Fluid; Room service appropriate? Yes; Fluid consistency: Thin  Diet effective now       Question Answer Comment  Fluid restriction: 1200 mL Fluid   Room service appropriate? Yes   Fluid consistency: Thin      08/06/24 1226           DVT prophylaxis: heparin  injection 5,000 Units Start: 07/27/24 2345  Level of care: Telemetry Cardiac   Code Status: Full Code  Subjective: Patient is seen and examined today after kidney biopsy. Resting comfortably. Father at bedside. Able to get out of bed. Son discomfort at IR biopsy sites.  Physical Exam: Vitals:   08/06/24 0955 08/06/24 1000 08/06/24 1005 08/06/24 1015  BP: (!) 140/96 (!) 133/92 (!) 135/97 (!) 126/97  Pulse: 88 88 88 90  Resp: 19 (!) 22 (!) 25 (!) 22  Temp:   98.4 F (36.9 C)   TempSrc:   Oral   SpO2: 100% 100% 96% 98%  Weight:      Height:        General - Young African-American male, no respiratory distress HEENT - PERRLA, EOMI, atraumatic head, non tender sinuses. Lung - Clear, basal rales, no rhonchi, wheezes. Heart - S1, S2 heard, no murmurs, rubs, trace pedal edema. Abdomen - Soft, non tender, bowel sounds good Neuro - Alert, awake and oriented x 3, non focal exam. Skin - Warm and dry.  Data Reviewed:      Latest Ref Rng & Units 08/06/2024    3:00 AM 08/05/2024    4:18 AM 08/04/2024    2:33 AM  CBC  WBC 4.0 - 10.5 K/uL 5.8  5.4  5.7   Hemoglobin 13.0 - 17.0 g/dL 8.5  8.2  8.4   Hematocrit 39.0 - 52.0 % 25.8  24.7  25.1   Platelets 150 - 400 K/uL 431  416  431       Latest Ref Rng & Units 08/06/2024    3:00 AM 08/05/2024    4:18 AM 08/04/2024    2:33 AM  BMP  Glucose 70 - 99 mg/dL 885  99  880   BUN 6 - 20 mg/dL 68  64  60    Creatinine 0.61 - 1.24 mg/dL 3.01  3.56  3.56   Sodium 135 - 145 mmol/L 131  130  131   Potassium 3.5 - 5.1 mmol/L 3.6  3.6  3.4   Chloride 98 - 111 mmol/L 97  95  97   CO2 22 - 32 mmol/L 22  21  21    Calcium 8.9 - 10.3 mg/dL 8.8  8.9  8.9    US  BIOPSY (KIDNEY) Result Date: 08/06/2024 INDICATION: [id number] AKI (acute kidney injury) [facility]) [id number] Hypertensive emergency [phone] EXAM: ULTRASOUND GUIDED RENAL BIOPSY, NON TARGETED COMPARISON:  US  RENAL, 07/31/2024. MEDICATIONS: None. ANESTHESIA/SEDATION: Moderate (conscious) sedation was employed during this procedure. A total of Versed  2 mg and Fentanyl  75 mcg was administered intravenously. Moderate Sedation Time: 24 minutes. The patient's level of consciousness and vital signs were monitored continuously by radiology nursing throughout the procedure under my direct supervision. COMPLICATIONS: None immediate. PROCEDURE: Informed written consent was obtained from the patient and/or patient's representative after a discussion of the risks, benefits and alternatives to treatment. The patient understands and consents the procedure. A timeout was performed prior to the initiation of the procedure. Ultrasound scanning was performed of the bilateral flanks. The inferior pole of the RIGHT kidney was selected for biopsy due to location and sonographic window. The procedure was planned. The operative site was prepped and draped in the usual sterile fashion. The overlying soft tissues were anesthetized with 1% lidocaine  with epinephrine. A 17 gauge core needle biopsy device was advanced into the inferior cortex of the RIGHT kidney and 3 core biopsies were obtained under direct ultrasound guidance. Images were saved for documentation purposes. Gelfoam slurry injection was performed. The biopsy device was then removed and superficial hemostasis was obtained with manual compression. Post procedural scanning was negative for significant post procedural hemorrhage or  additional complication. A dressing was placed. The patient tolerated the procedure well without immediate post procedural complication. IMPRESSION: Successful ultrasound guided non targeted RIGHT renal biopsy. Thom Hall, MD [facility] Electronically Signed   By: Thom Hall M.D.   On: 08/06/2024 15:00   Family Communication: Discussed with patient at bedside. He understand and agree. All questions answered.  Disposition: Status is: Inpatient Remains inpatient appropriate because: kidney bx, monitor renal function, BP control.  Planned Discharge Destination: Home     Time spent: 43 minutes.  Author: Concepcion Riser, MD 08/06/2024 3:28 PM Secure chat 7am to 7pm For on call review [email or web address].

## 2024-08-06 NOTE — Plan of Care (Signed)
   Problem: Health Behavior/Discharge Planning: Goal: Ability to manage health-related needs will improve Outcome: Progressing
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
## 2024-08-06 NOTE — Procedures (Signed)
 Vascular and Interventional Radiology Procedure Note  Patient: Alban Marucci DOB: Jul 03, 1986 Medical Record Number: [id number] Note Date/Time: 08/06/24 10:51 AM   Performing Physician: Thom Hall, MD Assistant(s): None  Diagnosis: AKI, Hx CKD. HTN   Procedure: RENAL BIOPSY, BIOPSY  Anesthesia: Conscious Sedation Complications: None Estimated Blood Loss: Minimal Specimens: Sent for Pathology  Findings:  Successful Ultrasound-guided biopsy of R kidney. A total of 3 samples were obtained. Hemostasis of the tract was achieved using Gelfoam Slurry Embolization.  Plan: Bed rest for 2 hours.  See detailed procedure note with images in PACS. The patient tolerated the procedure well without incident or complication and was returned to Recovery in stable condition.    Thom Hall, MD [facility]   Pager. [phone] Clinic. [phone]

## 2024-08-06 NOTE — Sedation Documentation (Signed)
 Pt stated BP meds were held this AM d/t NPO status. 3E and pharmacy aware, meds being sent to radiology for admin. MD Mugweru aware.

## 2024-08-07 ENCOUNTER — Other Ambulatory Visit (HOSPITAL_COMMUNITY): Payer: Self-pay

## 2024-08-07 LAB — BASIC METABOLIC PANEL WITH GFR
Anion gap: 14 (ref 5–15)
BUN: 66 mg/dL — ABNORMAL HIGH (ref 6–20)
CO2: 22 mmol/L (ref 22–32)
Calcium: 8.5 mg/dL — ABNORMAL LOW (ref 8.9–10.3)
Chloride: 97 mmol/L — ABNORMAL LOW (ref 98–111)
Creatinine, Ser: 6.76 mg/dL — ABNORMAL HIGH (ref 0.61–1.24)
GFR, Estimated: 10 mL/min — ABNORMAL LOW (ref >60)
Glucose, Bld: 142 mg/dL — ABNORMAL HIGH (ref 70–99)
Potassium: 3.5 mmol/L (ref 3.5–5.1)
Sodium: 133 mmol/L — ABNORMAL LOW (ref 135–145)

## 2024-08-07 LAB — CBC
HCT: 26.4 % — ABNORMAL LOW (ref 39.0–52.0)
Hemoglobin: 8.5 g/dL — ABNORMAL LOW (ref 13.0–17.0)
MCH: 28.7 pg (ref 26.0–34.0)
MCHC: 32.2 g/dL (ref 30.0–36.0)
MCV: 89.2 fL (ref 80.0–100.0)
Platelets: 433 K/uL — ABNORMAL HIGH (ref 150–400)
RBC: 2.96 MIL/uL — ABNORMAL LOW (ref 4.22–5.81)
RDW: 13.4 % (ref 11.5–15.5)
WBC: 6.9 K/uL (ref 4.0–10.5)
nRBC: 0 % (ref 0.0–0.2)

## 2024-08-07 LAB — 5 HIAA, QUANTITATIVE, URINE, 24 HOUR
5-HIAA, Ur: 2.4 mg/L
5-HIAA,Quant.,24 Hr Urine: 8.4 mg/(24.h) (ref 0.0–14.9)

## 2024-08-07 MED ORDER — HYDRALAZINE HCL 100 MG PO TABS
100.0000 mg | ORAL_TABLET | Freq: Three times a day (TID) | ORAL | 1 refills | Status: AC
  Filled 2024-08-07 – 2024-09-01 (×2): qty 90, 30d supply, fill #0
  Filled 2024-09-01: qty 90, 30d supply, fill #1

## 2024-08-07 MED ORDER — FERROUS SULFATE 325 (65 FE) MG PO TABS
325.0000 mg | ORAL_TABLET | Freq: Two times a day (BID) | ORAL | 1 refills | Status: AC
  Filled 2024-08-07 – 2024-09-01 (×2): qty 60, 30d supply, fill #0
  Filled 2024-09-01: qty 60, 30d supply, fill #1

## 2024-08-07 MED ORDER — ASCORBIC ACID 500 MG PO TABS
500.0000 mg | ORAL_TABLET | Freq: Every day | ORAL | 1 refills | Status: AC
  Filled 2024-08-07: qty 30, 30d supply, fill #0
  Filled 2024-09-01: qty 100, 100d supply, fill #0
  Filled 2024-09-01: qty 30, 30d supply, fill #1

## 2024-08-07 MED ORDER — ISOSORBIDE DINITRATE 20 MG PO TABS
40.0000 mg | ORAL_TABLET | Freq: Three times a day (TID) | ORAL | 1 refills | Status: AC
  Filled 2024-08-07 – 2024-09-01 (×2): qty 100, 17d supply, fill #0
  Filled 2024-09-01 – 2024-10-14 (×2): qty 100, 17d supply, fill #1

## 2024-08-07 MED ORDER — AMLODIPINE BESYLATE 10 MG PO TABS
10.0000 mg | ORAL_TABLET | Freq: Every day | ORAL | 1 refills | Status: AC
  Filled 2024-08-07 – 2024-09-01 (×2): qty 30, 30d supply, fill #0
  Filled 2024-09-01: qty 30, 30d supply, fill #1

## 2024-08-07 MED ORDER — CARVEDILOL 12.5 MG PO TABS
12.5000 mg | ORAL_TABLET | Freq: Two times a day (BID) | ORAL | 1 refills | Status: AC
  Filled 2024-08-07 – 2024-09-01 (×2): qty 60, 30d supply, fill #0
  Filled 2024-09-01 – 2024-10-14 (×2): qty 60, 30d supply, fill #1
  Filled 2024-12-22: qty 60, 30d supply, fill #2

## 2024-08-07 MED ORDER — FUROSEMIDE 20 MG PO TABS
60.0000 mg | ORAL_TABLET | Freq: Every day | ORAL | 0 refills | Status: AC | PRN
  Filled 2024-08-07: qty 60, 20d supply, fill #0

## 2024-08-07 MED ORDER — CLONIDINE HCL 0.1 MG PO TABS
0.1000 mg | ORAL_TABLET | Freq: Two times a day (BID) | ORAL | 1 refills | Status: AC
  Filled 2024-08-07 – 2024-09-01 (×2): qty 60, 30d supply, fill #0
  Filled 2024-09-01 – 2024-10-14 (×2): qty 60, 30d supply, fill #1
  Filled 2024-12-22: qty 60, 30d supply, fill #2

## 2024-08-07 NOTE — Plan of Care (Signed)
  Problem: Education: Goal: Knowledge of General Education information will improve Description: Including pain rating scale, medication(s)/side effects and non-pharmacologic comfort measures Outcome: Progressing   Problem: Health Behavior/Discharge Planning: Goal: Ability to manage health-related needs will improve Outcome: Progressing   Problem: Clinical Measurements: Goal: Ability to maintain clinical measurements within normal limits will improve Outcome: Progressing Goal: Will remain free from infection Outcome: Progressing Goal: Diagnostic test results will improve Outcome: Progressing Goal: Respiratory complications will improve Outcome: Progressing Goal: Cardiovascular complication will be avoided Outcome: Progressing   Problem: Nutrition: Goal: Adequate nutrition will be maintained Outcome: Progressing   Problem: Coping: Goal: Level of anxiety will decrease Outcome: Progressing   Problem: Elimination: Goal: Will not experience complications related to bowel motility Outcome: Progressing Goal: Will not experience complications related to urinary retention Outcome: Progressing   Problem: Pain Managment: Goal: General experience of comfort will improve and/or be controlled Outcome: Progressing   Problem: Safety: Goal: Ability to remain free from injury will improve Outcome: Progressing

## 2024-08-07 NOTE — Plan of Care (Signed)
 ?  Problem: Clinical Measurements: ?Goal: Will remain free from infection ?Outcome: Progressing ?  ?
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
## 2024-08-07 NOTE — Discharge Summary (Signed)
 Physician Discharge Summary   [name] MRN: [id number] DOB: 1986-09-02  Admit date:     07/27/2024  Discharge date: 08/07/24  Discharge Physician: Concepcion Riser   PCP: Benjamine Aland, MD   Recommendations at discharge:    PCP follow up in 1 week. Nephrology follow up as scheduled. Cardiology follow up suggested. Lasix  as needed for fluid and sob prescribed.  Discharge Diagnoses: Principal Problem:   Cardiomyopathy (HCC) Active Problems:   Acute systolic heart failure (HCC)   Acute congestive heart failure (HCC)   DOE (dyspnea on exertion)   Hypertensive emergency   AKI (acute kidney injury) (HCC)   Elevated troponin  Resolved Problems:   * No resolved hospital problems. *  Hospital Course: Nathan Heath is a 38 yr old African American male with history of hypertension, CKD presented for shortness of breath and orthopnea, PND admitted to heart failure service for new onset systolic CHF started on IV diuretics with good urine output. His symptoms did improve. Nephrology consulted for renal failure and need for HD. TRH service asked to take over his care.   Assessment and Plan: Acute systolic CHF- Presented with shortness of breath, orthopnea, PND, BNP of 27,000, troponin 138. Echocardiogram reviewed shows EF 35 to 40%, global hypokinesis. Unable to perform heart cath due to his renal dysfunction. Concern for amyloidosis as mother being worked up.  Continue to monitor daily weights, strict input and output. Avoid ACE inhibitors, ARB, SGLT2 due to renal failure. CHF instructions, fluid restriction advised. Lasix  as needed prescribed per nephrology recommendations. Advised to follow heart failure team.   Acute worsening of CKD stage 3b- Cr stable around 6.76, Nephrology advised Kidney biopsy. No HD at this time. Kidney biopsy done 08/06/24 by IR. Biopsy results pending. Monitor renal function as outpatient.  Avoid nephrotoxic drugs including ACE  inhibitors, ARB, SGLT2. Prescribed Lasix  as needed per nephro team. Patient understands that he may need dialysis if kidney function does not improve. Nephrology follow up to be scheduled.   Uncontrolled hypertension Hypertensive emergency - Blood pressure much improved.   Continue hydralazine  100 q8, Isordil  40 q8, Norvasc  10 every day, Coreg  12.5 bid, Clonidine  0.1 bid. Scripts sent to [facility] pharmacy for 90 day supply.   Normocytic Anemia- Hb stable around 8.5, baseline normal Hb (2023). 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 He denies active bleeding, check FOBT. Iron  supplements with vit c oral scripts sent.   Hypokalemia- Resolved post repletion.   Hyponatremia- Stable sodium at 133. Outpatient electrolyte monitoring.      Consultants: Heart failure, Nephrology, IR Procedures performed: Kidney biopsy  Disposition: Home Diet recommendation:  Discharge Diet Orders (From admission, onward)     Start     Ordered   08/07/24 0000  Diet - low sodium heart healthy        08/07/24 1326           Cardiac diet DISCHARGE MEDICATION: Allergies as of 08/07/2024   No Known Allergies      Medication List     STOP taking these medications    ASPIRIN PO   BEET ROOT PO   OVER THE COUNTER MEDICATION       TAKE these medications    amLODipine  10 MG tablet Commonly known as: NORVASC  Take 1 tablet (10 mg total) by mouth daily. Start taking on: August 08, 2024 What changed:  medication strength how much to take   carvedilol  12.5 MG tablet Commonly known as: COREG  Take 1 tablet (  12.5 mg total) by mouth 2 (two) times daily with a meal.   cloNIDine  0.1 MG tablet Commonly known as: CATAPRES  Take 1 tablet (0.1 mg total) by mouth 2 (two) times daily.   ferrous sulfate  325 (65 FE) MG tablet Take 1 tablet (325 mg total) by mouth 2 (two) times daily with a meal.   furosemide  20 MG tablet Commonly known as: Lasix  Take 3 tablets (60 mg total)  by mouth daily as needed for fluid or edema.   hydrALAZINE  100 MG tablet Commonly known as: APRESOLINE  Take 1 tablet (100 mg total) by mouth every 8 (eight) hours.   isosorbide  dinitrate 40 MG tablet Commonly known as: ISORDIL  Take 1 tablet (40 mg total) by mouth every 8 (eight) hours.   vitamin C 250 MG tablet Commonly known as: ASCORBIC ACID  Take 2 tablets (500 mg total) by mouth daily.        Follow-up Information     [facility] Follow up on 08/06/2024.   Specialty: Cardiology Why: [facility] 2 pm Entrance C, Free Valet Parking Please bring all meds to appointment Contact information: [address] [place]  [address] [phone]        Durel Riggs, FNP. Go to.   Specialty: Nurse Practitioner Why: 08/11/2024 @11 :00 A[employer] card, and ID. Contact information: [address] [place] [address] [phone]                Discharge Exam: Filed Weights   08/05/24 0526 08/06/24 0424 08/07/24 0542  Weight: 97.1 kg 97.9 kg 98.3 kg      08/07/2024   11:19 AM 08/07/2024    7:17 AM 08/07/2024    5:42 AM  Vitals with BMI  Weight   216 lbs 13 oz  BMI   30.25  Systolic 113 119 859  Diastolic 84 85 97  Pulse 83 84 88   General - Young African-American male, no respiratory distress HEENT - PERRLA, EOMI, atraumatic head, non tender sinuses. Lung - Clear, basal rales, no rhonchi, wheezes. Heart - S1, S2 heard, no murmurs, rubs, trace pedal edema. Abdomen - Soft, non tender, bowel sounds good Neuro - Alert, awake and oriented x 3, non focal exam. Skin - Warm and dry.  Condition at discharge: stable  The results of significant diagnostics from this hospitalization (including imaging, microbiology, ancillary and laboratory) are listed below for reference.   Imaging Studies: US  BIOPSY (KIDNEY) Result Date: 08/06/2024 INDICATION: [id number] AKI (acute  kidney injury) [facility]) [id number] Hypertensive emergency [phone] EXAM: ULTRASOUND GUIDED RENAL BIOPSY, NON TARGETED COMPARISON:  US  RENAL, 07/31/2024. MEDICATIONS: None. ANESTHESIA/SEDATION: Moderate (conscious) sedation was employed during this procedure. A total of Versed  2 mg and Fentanyl  75 mcg was administered intravenously. Moderate Sedation Time: 24 minutes. The patient's level of consciousness and vital signs were monitored continuously by radiology nursing throughout the procedure under my direct supervision. COMPLICATIONS: None immediate. PROCEDURE: Informed written consent was obtained from the patient and/or patient's representative after a discussion of the risks, benefits and alternatives to treatment. The patient understands and consents the procedure. A timeout was performed prior to the initiation of the procedure. Ultrasound scanning was performed of the bilateral flanks. The inferior pole of the RIGHT kidney was selected for biopsy due to location and sonographic window. The procedure was planned. The operative site was prepped and draped in the usual sterile fashion. The overlying soft tissues were anesthetized with 1% lidocaine   with epinephrine. A 17 gauge core needle biopsy device was advanced into the inferior cortex of the RIGHT kidney and 3 core biopsies were obtained under direct ultrasound guidance. Images were saved for documentation purposes. Gelfoam slurry injection was performed. The biopsy device was then removed and superficial hemostasis was obtained with manual compression. Post procedural scanning was negative for significant post procedural hemorrhage or additional complication. A dressing was placed. The patient tolerated the procedure well without immediate post procedural complication. IMPRESSION: Successful ultrasound guided non targeted RIGHT renal biopsy. Thom Hall, MD [facility] Electronically Signed   By: Thom Hall M.D.   On: 08/06/2024 15:00   US  RENAL Result Date: 07/31/2024 EXAM: US  Retroperitoneum Complete, Renal. CLINICAL HISTORY: 409830 AKI (acute kidney injury) (HCC). TECHNIQUE: Real-time ultrasound of the retroperitoneum (complete) with image documentation. COMPARISON: None provided. FINDINGS: RIGHT KIDNEY: The right kidney measures 10.4 x 5.2 x 5 cm, with a calculated volume of 135.42 cc. Echogenic renal parenchyma. No hydronephrosis, renal stone, or mass visualized. LEFT KIDNEY: The left kidney measures 11 x 5.2 x 6 cm, with a calculated volume of 181.2 cc. No hydronephrosis, renal stone, or mass visualized. BLADDER: The urinary bladder is incompletely distended. Bilateral ureteral jets are demonstrated. IMPRESSION: 1. No hydronephrosis or renal stone. 2. Echogenic renal parenchyma, a nonspecific indicator of medical renal disease. Electronically signed by: Katheleen Faes MD 07/31/2024 02:21 PM EDT RP Workstation: [id number]   VAS US  RENAL ARTERY DUPLEX Result Date: 07/30/2024 ABDOMINAL VISCERAL Patient Name:  Nathan Heath  Date of Exam:   07/29/2024 Medical Rec #: [id number]        Accession #:    [id number] Date of Birth: 1986-05-25        Patient Gender: M Patient Age:   38 years Exam Location:  [facility] Procedure:      VAS US  RENAL ARTERY DUPLEX Referring Phys: [id number] TRACI R TURNER -------------------------------------------------------------------------------- High Risk Factors: Hypertension, past history of smoking. Other Factors: CKD. Limitations: Obesity, air/bowel gas, patient discomfort and Labored breathing. Performing Technologist: Edilia Elden Appl  Examination Guidelines: A complete evaluation includes B-mode imaging, spectral Doppler, color Doppler, and power Doppler as needed of all accessible portions of each vessel. Bilateral testing is considered an integral part of a complete examination. Limited examinations for reoccurring indications may be performed as noted.  Duplex  Findings: +----------------------+--------+--------+------+--------+ Mesenteric            PSV cm/sEDV cm/sPlaqueComments +----------------------+--------+--------+------+--------+ Aorta Mid               142      19                  +----------------------+--------+--------+------+--------+ Celiac Artery Proximal  111      25                  +----------------------+--------+--------+------+--------+ SMA Proximal            199      26                  +----------------------+--------+--------+------+--------+    +------------------+--------+--------+-------+ Right Renal ArteryPSV cm/sEDV cm/sComment +------------------+--------+--------+-------+ Origin               95      18           +------------------+--------+--------+-------+ Proximal            138      19           +------------------+--------+--------+-------+  Mid                 160      20           +------------------+--------+--------+-------+ Distal               17      5            +------------------+--------+--------+-------+ +-----------------+--------+--------+-------+ Left Renal ArteryPSV cm/sEDV cm/sComment +-----------------+--------+--------+-------+ Origin                             N/V   +-----------------+--------+--------+-------+ Proximal            99      12           +-----------------+--------+--------+-------+ Mid                140      19           +-----------------+--------+--------+-------+ Distal              36      10           +-----------------+--------+--------+-------+ +------------+--------+--------+----+-----------+--------+--------+----+ Right KidneyPSV cm/sEDV cm/sRI  Left KidneyPSV cm/sEDV cm/sRI   +------------+--------+--------+----+-----------+--------+--------+----+ Upper Pole  11      5       0.53Upper Pole 13      5       0.59 +------------+--------+--------+----+-----------+--------+--------+----+ Mid         11       8       0.        10      7       0.32 +------------+--------+--------+----+-----------+--------+--------+----+ Lower Pole  23      10      0.57Lower Pole 16      8       0.51 +------------+--------+--------+----+-----------+--------+--------+----+ Hilar       13      8       0.37Hilar      17      8       0.56 +------------+--------+--------+----+-----------+--------+--------+----+ +------------------+-----+------------------+----+ Right Kidney           Left Kidney            +------------------+-----+------------------+----+ RAR                    RAR                    +------------------+-----+------------------+----+ RAR (manual)      1.1  RAR (manual)      1.0  +------------------+-----+------------------+----+ Cortex                 Cortex                 +------------------+-----+------------------+----+ Cortex thickness       Corex thickness        +------------------+-----+------------------+----+ Kidney length (cm)10.27Kidney length (cm)9.78 +------------------+-----+------------------+----+  Summary: Renal:  Right: Normal size right kidney. Normal right Resistive Index. 1-59%        stenosis of the right renal artery. RRV flow present. Left:  Normal size of left kidney. Normal left Resistive Index. No        evidence of left renal artery stenosis. LRV flow present. Mesenteric: Normal Celiac artery and Superior Mesenteric artery findings.  *See table(s) above for measurements and observations.  Diagnosing physician: Lonni Gaskins MD  Electronically signed  by Lonni Gaskins MD on 07/30/2024 at 8:04:48 AM.    Final    ECHOCARDIOGRAM COMPLETE Result Date: 07/28/2024    ECHOCARDIOGRAM REPORT   Patient Name:   Nathan Heath Date of Exam: 07/28/2024 Medical Rec #:  [id number]       Height:       71.0 in Accession #:    [id number]      Weight:       226.0 lb Date of Birth:  04-20-1986       BSA:          2.221 m Patient Age:    38 years         BP:           165/112 mmHg Patient Gender: M               HR:           112 bpm. Exam Location:  Inpatient Procedure: 2D Echo, Intracardiac Opacification Agent, Color Doppler and Cardiac            Doppler (Both Spectral and Color Flow Doppler were utilized during            procedure). Indications:    Dyspnea R06.00  History:        Patient has no prior history of Echocardiogram examinations.  Sonographer:    [name] Referring Phys: [id number] VINCENT A DELGADO IMPRESSIONS  1. Prominent apical and lateral trabeculations with definity . Consider f/u cardiac MRI to quantitate EF and r/o ventricular non compaction. . Left ventricular ejection fraction, by estimation, is 35 to 40%. The left ventricle has moderately decreased function. The left ventricle demonstrates global hypokinesis. The left ventricular internal cavity size was moderately dilated. There is mild left ventricular hypertrophy. Left ventricular diastolic parameters were normal.  2. Right ventricular systolic function is normal. The right ventricular size is normal.  3. Left atrial size was severely dilated.  4. Right atrial size was moderately dilated.  5. The mitral valve is abnormal. Mild mitral valve regurgitation. No evidence of mitral stenosis.  6. The aortic valve is tricuspid. There is mild calcification of the aortic valve. There is mild thickening of the aortic valve. Aortic valve regurgitation is not visualized. Aortic valve sclerosis is present, with no evidence of aortic valve stenosis.  7. The inferior vena cava is normal in size with greater than 50% respiratory variability, suggesting right atrial pressure of 3 mmHg. FINDINGS  Left Ventricle: Prominent apical and lateral trabeculations with definity . Consider f/u cardiac MRI to quantitate EF and r/o ventricular non compaction. Left ventricular ejection fraction, by estimation, is 35 to 40%. The left ventricle has moderately decreased function. The left ventricle demonstrates global  hypokinesis. Strain was performed and the global longitudinal strain is indeterminate. The left ventricular internal cavity size was moderately dilated. There is mild left ventricular hypertrophy. Left ventricular diastolic parameters were normal. Right Ventricle: The right ventricular size is normal. No increase in right ventricular wall thickness. Right ventricular systolic function is normal. Left Atrium: Left atrial size was severely dilated. Right Atrium: Right atrial size was moderately dilated. Pericardium: There is no evidence of pericardial effusion. Mitral Valve: The mitral valve is abnormal. There is mild thickening of the mitral valve leaflet(s). Mild mitral valve regurgitation. No evidence of mitral valve stenosis. Tricuspid Valve: The tricuspid valve is normal in structure. Tricuspid valve regurgitation is mild . No evidence of tricuspid stenosis. Aortic Valve: The aortic valve is tricuspid. There is mild calcification of  the aortic valve. There is mild thickening of the aortic valve. Aortic valve regurgitation is not visualized. Aortic valve sclerosis is present, with no evidence of aortic valve stenosis. Pulmonic Valve: The pulmonic valve was normal in structure. Pulmonic valve regurgitation is trivial. No evidence of pulmonic stenosis. Aorta: The aortic root is normal in size and structure. Venous: The inferior vena cava is normal in size with greater than 50% respiratory variability, suggesting right atrial pressure of 3 mmHg. IAS/Shunts: No atrial level shunt detected by color flow Doppler. Additional Comments: 3D was performed not requiring image post processing on an independent workstation and was indeterminate.  LEFT VENTRICLE PLAX 2D LVIDd:         6.50 cm      Diastology LVIDs:         5.30 cm      LV e' medial:  14.40 cm/s LV PW:         1.30 cm      LV e' lateral: 7.62 cm/s LV IVS:        1.20 cm LVOT diam:     2.40 cm LV SV:         103 LV SV Index:   46 LVOT Area:     4.52 cm  LV  Volumes (MOD) LV vol d, MOD A2C: 247.0 ml LV vol d, MOD A4C: 238.0 ml LV vol s, MOD A2C: 148.0 ml LV vol s, MOD A4C: 153.0 ml LV SV MOD A2C:     99.0 ml LV SV MOD A4C:     238.0 ml LV SV MOD BP:      94.2 ml RIGHT VENTRICLE             IVC RV S prime:     13.90 cm/s  IVC diam: 1.40 cm TAPSE (M-mode): 2.3 cm LEFT ATRIUM           Index        RIGHT ATRIUM           Index LA diam:      5.40 cm 2.43 cm/m   RA Area:     22.60 cm LA Vol (A4C): 81.1 ml 36.52 ml/m  RA Volume:   63.90 ml  28.77 ml/m  AORTIC VALVE LVOT Vmax:   135.00 cm/s LVOT Vmean:  96.900 cm/s LVOT VTI:    0.228 m  AORTA Ao Root diam: 3.60 cm Ao Asc diam:  3.50 cm  SHUNTS Systemic VTI:  0.23 m Systemic Diam: 2.40 cm Maude Emmer MD Electronically signed by Maude Emmer MD Signature Date/Time: 07/28/2024/12:14:38 PM    Final    DG Chest 2 View Result Date: 07/27/2024 CLINICAL DATA:  Shortness of breath. EXAM: CHEST - 2 VIEW COMPARISON:  November 13, 2019. FINDINGS: Stable cardiomediastinal silhouette. Minimal perihilar and bibasilar interstitial densities are noted with possible Kerley B lines present, suggesting minimal pulmonary edema. Bony thorax is unremarkable. IMPRESSION: Possible minimal bilateral pulmonary edema. Electronically Signed   By: Lynwood Landy Raddle M.D.   On: 07/27/2024 16:59    Microbiology: Results for orders placed or performed during the hospital encounter of 07/27/24  Group A Strep by PCR     Status: None   Collection Time: 07/27/24  5:29 PM   Specimen: Throat; Sterile Swab  Result Value Ref Range Status   Group A Strep by PCR NOT DETECTED NOT DETECTED Final    Comment: Performed at [facility], [address] [place], [place] [address]  Labs: CBC: Recent Labs  Lab 08/03/24 0427 08/04/24 0233 08/05/24 0418 08/06/24 0300 08/07/24 0655  WBC 7.3 5.7 5.4 5.8 6.9  HGB 8.5* 8.4* 8.2* 8.5* 8.5*  HCT 25.2* 25.1* 24.7* 25.8* 26.4*  MCV 86.3 87.8 87.0 87.5 89.2  PLT 422* 431* 416* 431* 433*   Basic  Metabolic Panel: Recent Labs  Lab 08/01/24 0238 08/02/24 0220 08/03/24 0427 08/04/24 0233 08/05/24 0418 08/06/24 0300 08/07/24 0302  NA 132*   < > 132* 131* 130* 131* 133*  K 3.5   < > 3.8 3.4* 3.6 3.6 3.5  CL 98   < > 97* 97* 95* 97* 97*  CO2 21*   < > 22 21* 21* 22 22  GLUCOSE 108*   < > 115* 119* 99 114* 142*  BUN 52*   < > 60* 60* 64* 68* 66*  CREATININE 6.78*   < > 6.47* 6.43* 6.43* 6.98* 6.76*  CALCIUM 9.1   < > 9.1 8.9 8.9 8.8* 8.5*  MG 2.1  --   --   --   --   --   --    < > = values in this interval not displayed.   Liver Function Tests: No results for input(s): AST, ALT, ALKPHOS, BILITOT, PROT, ALBUMIN in the last 168 hours. CBG: No results for input(s): GLUCAP in the last 168 hours.  Discharge time spent: 37 minutes.  Signed: Concepcion Riser, MD [facility] 08/07/2024

## 2024-08-07 NOTE — TOC Transition Note (Signed)
 Transition of Care [facility]) - Discharge Note   Patient Details  Name: Nathan Heath MRN: [id number] Date of Birth: December 17, 1985  [facility] CM/SW Contact:  Waddell Barnie Rama, RN Phone Number: 08/07/2024, 1:39 PM   Clinical Narrative:    For dc, TOC to fill meds.      Barriers to Discharge: Continued Medical Work up   Patient Goals and CMS Choice Patient states their goals for this hospitalization and ongoing recovery are:: wants to remain independent          Discharge Placement                       Discharge Plan and Services Additional resources added to the After Visit Summary for                                       Social Drivers of Health (SDOH) Interventions SDOH Screenings   Food Insecurity: No Food Insecurity (07/28/2024)  Housing: Low Risk  (07/28/2024)  Transportation Needs: No Transportation Needs (07/28/2024)  Utilities: Not At Risk (07/28/2024)  Tobacco Use: Medium Risk (07/28/2024)     Readmission Risk Interventions     No data to display

## 2024-08-08 ENCOUNTER — Encounter (HOSPITAL_COMMUNITY): Payer: Self-pay

## 2024-08-08 LAB — SURGICAL PATHOLOGY

## 2024-09-01 ENCOUNTER — Other Ambulatory Visit (HOSPITAL_COMMUNITY): Payer: Self-pay

## 2024-09-05 ENCOUNTER — Other Ambulatory Visit (HOSPITAL_COMMUNITY): Payer: Self-pay

## 2024-09-05 MED ORDER — CLONIDINE HCL 0.1 MG PO TABS: 0.1000 mg | ORAL_TABLET | Freq: Two times a day (BID) | ORAL | 1 refills | Status: AC

## 2024-09-08 ENCOUNTER — Other Ambulatory Visit (HOSPITAL_COMMUNITY): Payer: Self-pay

## 2024-10-10 ENCOUNTER — Other Ambulatory Visit: Payer: Self-pay

## 2024-10-10 ENCOUNTER — Other Ambulatory Visit (HOSPITAL_BASED_OUTPATIENT_CLINIC_OR_DEPARTMENT_OTHER): Payer: Self-pay

## 2024-10-10 ENCOUNTER — Other Ambulatory Visit (HOSPITAL_COMMUNITY): Payer: Self-pay

## 2024-10-10 MED ORDER — FUROSEMIDE 20 MG PO TABS
20.0000 mg | ORAL_TABLET | Freq: Three times a day (TID) | ORAL | 1 refills | Status: AC
  Filled 2024-10-10: qty 270, 90d supply, fill #0

## 2024-10-10 MED ORDER — AMLODIPINE BESYLATE 10 MG PO TABS
10.0000 mg | ORAL_TABLET | Freq: Every day | ORAL | 1 refills | Status: AC
  Filled 2024-10-10: qty 90, 90d supply, fill #0

## 2024-10-10 MED ORDER — HYDRALAZINE HCL 100 MG PO TABS
100.0000 mg | ORAL_TABLET | Freq: Every day | ORAL | 1 refills | Status: AC
  Filled 2024-10-10: qty 90, 90d supply, fill #0
  Filled 2024-12-22: qty 90, 90d supply, fill #1

## 2024-10-10 MED ORDER — FERROUS SULFATE 325 (65 FE) MG PO TABS
325.0000 mg | ORAL_TABLET | Freq: Two times a day (BID) | ORAL | 1 refills | Status: AC
  Filled 2024-10-10: qty 100, 50d supply, fill #0

## 2024-12-22 ENCOUNTER — Other Ambulatory Visit (HOSPITAL_COMMUNITY): Payer: Self-pay
